# Patient Record
Sex: Male | Born: 1970 | Race: Black or African American | Hispanic: No | Marital: Married | Smoking: Former smoker
Health system: Southern US, Community
[De-identification: ages and names within clinical notes are randomized; demographics above are authoritative.]

---

## 2004-03-12 ENCOUNTER — Emergency Department (HOSPITAL_COMMUNITY): Admission: EM | Admit: 2004-03-12 | Discharge: 2004-03-12 | Payer: Self-pay | Admitting: Emergency Medicine

## 2007-08-19 ENCOUNTER — Emergency Department (HOSPITAL_COMMUNITY): Admission: EM | Admit: 2007-08-19 | Discharge: 2007-08-19 | Payer: Self-pay | Admitting: Emergency Medicine

## 2007-09-07 ENCOUNTER — Emergency Department (HOSPITAL_COMMUNITY): Admission: EM | Admit: 2007-09-07 | Discharge: 2007-09-07 | Payer: Self-pay | Admitting: *Deleted

## 2007-09-14 ENCOUNTER — Emergency Department (HOSPITAL_COMMUNITY): Admission: EM | Admit: 2007-09-14 | Discharge: 2007-09-14 | Payer: Self-pay | Admitting: Family Medicine

## 2008-01-02 ENCOUNTER — Emergency Department (HOSPITAL_COMMUNITY): Admission: EM | Admit: 2008-01-02 | Discharge: 2008-01-02 | Payer: Self-pay | Admitting: Family Medicine

## 2008-04-29 ENCOUNTER — Encounter: Admission: RE | Admit: 2008-04-29 | Discharge: 2008-04-29 | Payer: Self-pay | Admitting: Internal Medicine

## 2010-10-22 LAB — GC/CHLAMYDIA PROBE AMP, GENITAL: GC Probe Amp, Genital: NEGATIVE

## 2011-07-13 ENCOUNTER — Emergency Department (HOSPITAL_COMMUNITY): Payer: Medicaid Other

## 2011-07-13 ENCOUNTER — Encounter (HOSPITAL_COMMUNITY): Payer: Self-pay | Admitting: *Deleted

## 2011-07-13 ENCOUNTER — Emergency Department (HOSPITAL_COMMUNITY)
Admission: EM | Admit: 2011-07-13 | Discharge: 2011-07-13 | Disposition: A | Discharge status: Home / Self Care | Payer: Medicaid Other | Attending: Emergency Medicine | Admitting: Emergency Medicine

## 2011-07-13 DIAGNOSIS — M542 Cervicalgia: Secondary | ICD-10-CM | POA: Insufficient documentation

## 2011-07-13 DIAGNOSIS — F17200 Nicotine dependence, unspecified, uncomplicated: Secondary | ICD-10-CM | POA: Insufficient documentation

## 2011-07-13 MED ORDER — HYDROCODONE-ACETAMINOPHEN 5-325 MG PO TABS: 2.0000 | ORAL_TABLET | ORAL | Status: AC | PRN

## 2011-07-13 MED ORDER — CYCLOBENZAPRINE HCL 10 MG PO TABS: 10.0000 mg | ORAL_TABLET | Freq: Two times a day (BID) | ORAL | Status: AC | PRN
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
## 2012-02-19 ENCOUNTER — Ambulatory Visit (HOSPITAL_COMMUNITY)
Admission: RE | Admit: 2012-02-19 | Discharge: 2012-02-19 | Disposition: A | Discharge status: Home / Self Care | Payer: Medicaid Other | Source: Ambulatory Visit | Attending: Interventional Radiology | Admitting: Interventional Radiology

## 2012-02-19 ENCOUNTER — Other Ambulatory Visit (HOSPITAL_COMMUNITY): Payer: Self-pay | Admitting: Interventional Radiology

## 2012-02-19 DIAGNOSIS — Z792 Long term (current) use of antibiotics: Secondary | ICD-10-CM

## 2012-11-03 ENCOUNTER — Emergency Department (HOSPITAL_COMMUNITY)
Admission: EM | Admit: 2012-11-03 | Discharge: 2012-11-03 | Disposition: A | Discharge status: Home / Self Care | Payer: No Typology Code available for payment source | Attending: Emergency Medicine | Admitting: Emergency Medicine

## 2012-11-03 ENCOUNTER — Encounter (HOSPITAL_COMMUNITY): Payer: Self-pay | Admitting: Emergency Medicine

## 2012-11-03 DIAGNOSIS — Y92410 Unspecified street and highway as the place of occurrence of the external cause: Secondary | ICD-10-CM | POA: Insufficient documentation

## 2012-11-03 DIAGNOSIS — F17200 Nicotine dependence, unspecified, uncomplicated: Secondary | ICD-10-CM | POA: Insufficient documentation

## 2012-11-03 DIAGNOSIS — Y9389 Activity, other specified: Secondary | ICD-10-CM | POA: Insufficient documentation

## 2012-11-03 DIAGNOSIS — M542 Cervicalgia: Secondary | ICD-10-CM

## 2012-11-03 DIAGNOSIS — Z79899 Other long term (current) drug therapy: Secondary | ICD-10-CM | POA: Insufficient documentation

## 2012-11-03 DIAGNOSIS — S0993XA Unspecified injury of face, initial encounter: Secondary | ICD-10-CM | POA: Insufficient documentation

## 2012-11-03 MED ORDER — DIAZEPAM 5 MG PO TABS: 5.0000 mg | ORAL_TABLET | Freq: Two times a day (BID) | ORAL | Status: DC
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
## 2012-11-03 NOTE — ED Provider Notes (Signed)
Medical screening examination/treatment/procedure(s) were performed by non-physician practitioner and as supervising physician I was immediately available for consultation/collaboration.   Dagmar Hait, MD 11/03/12 [phone]
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
## 2015-01-20 DIAGNOSIS — H524 Presbyopia: Secondary | ICD-10-CM | POA: Diagnosis not present

## 2015-01-20 DIAGNOSIS — H52221 Regular astigmatism, right eye: Secondary | ICD-10-CM | POA: Diagnosis not present

## 2015-01-20 DIAGNOSIS — H5203 Hypermetropia, bilateral: Secondary | ICD-10-CM | POA: Diagnosis not present

## 2015-04-01 ENCOUNTER — Ambulatory Visit: Payer: Self-pay | Admitting: Podiatry

## 2016-03-03 ENCOUNTER — Emergency Department (HOSPITAL_COMMUNITY)
Admission: EM | Admit: 2016-03-03 | Discharge: 2016-03-03 | Disposition: A | Discharge status: Home / Self Care | Payer: No Typology Code available for payment source | Attending: Emergency Medicine | Admitting: Emergency Medicine

## 2016-03-03 ENCOUNTER — Encounter (HOSPITAL_COMMUNITY): Payer: Self-pay | Admitting: Emergency Medicine

## 2016-03-03 DIAGNOSIS — S161XXA Strain of muscle, fascia and tendon at neck level, initial encounter: Secondary | ICD-10-CM

## 2016-03-03 DIAGNOSIS — Y939 Activity, unspecified: Secondary | ICD-10-CM | POA: Diagnosis not present

## 2016-03-03 DIAGNOSIS — M62830 Muscle spasm of back: Secondary | ICD-10-CM | POA: Diagnosis not present

## 2016-03-03 DIAGNOSIS — Y92410 Unspecified street and highway as the place of occurrence of the external cause: Secondary | ICD-10-CM | POA: Diagnosis not present

## 2016-03-03 DIAGNOSIS — Y999 Unspecified external cause status: Secondary | ICD-10-CM | POA: Diagnosis not present

## 2016-03-03 DIAGNOSIS — Z79899 Other long term (current) drug therapy: Secondary | ICD-10-CM | POA: Insufficient documentation

## 2016-03-03 DIAGNOSIS — F17210 Nicotine dependence, cigarettes, uncomplicated: Secondary | ICD-10-CM | POA: Diagnosis not present

## 2016-03-03 DIAGNOSIS — M545 Low back pain, unspecified: Secondary | ICD-10-CM

## 2016-03-03 DIAGNOSIS — S199XXA Unspecified injury of neck, initial encounter: Secondary | ICD-10-CM | POA: Diagnosis present

## 2016-03-03 MED ORDER — NAPROXEN 500 MG PO TABS: 500.0000 mg | ORAL_TABLET | Freq: Two times a day (BID) | ORAL | 0 refills | Status: DC | PRN

## 2016-03-03 MED ORDER — CYCLOBENZAPRINE HCL 10 MG PO TABS: 10.0000 mg | ORAL_TABLET | Freq: Three times a day (TID) | ORAL | 0 refills | Status: DC | PRN
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
## 2016-03-03 NOTE — Discharge Instructions (Signed)
Take naprosyn as directed for inflammation and pain with tylenol for breakthrough pain and flexeril for muscle relaxation. Do not drive or operate machinery with muscle relaxant use. Use heat to areas of soreness, no more than 20 minutes at a time every hour. Expect to be sore for the next few days and follow up with primary care physician for recheck of ongoing symptoms in the next 1-2 weeks. Return to ER for emergent changing or worsening of symptoms.
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
## 2017-01-03 ENCOUNTER — Other Ambulatory Visit: Payer: Self-pay

## 2017-01-03 DIAGNOSIS — Z5321 Procedure and treatment not carried out due to patient leaving prior to being seen by health care provider: Secondary | ICD-10-CM | POA: Insufficient documentation

## 2017-01-03 DIAGNOSIS — M549 Dorsalgia, unspecified: Secondary | ICD-10-CM | POA: Insufficient documentation

## 2017-01-04 ENCOUNTER — Emergency Department (HOSPITAL_COMMUNITY)
Admission: EM | Admit: 2017-01-04 | Discharge: 2017-01-05 | Disposition: A | Discharge status: Home / Self Care | Payer: Self-pay | Attending: Emergency Medicine | Admitting: Emergency Medicine

## 2017-01-04 ENCOUNTER — Encounter (HOSPITAL_COMMUNITY): Payer: Self-pay | Admitting: Emergency Medicine

## 2017-01-04 ENCOUNTER — Other Ambulatory Visit: Payer: Self-pay

## 2017-01-04 MED ORDER — OXYCODONE-ACETAMINOPHEN 5-325 MG PO TABS
1.0000 | ORAL_TABLET | ORAL | Status: DC | PRN
  Administered 2017-01-04: 1 via ORAL
  Filled 2017-01-04: qty 1

## 2017-01-04 NOTE — ED Triage Notes (Addendum)
Pt reports last night he has abdominal pain, strained to have a bowel movement.  He reports feeling like something "teared" and now is having extreme back pain that is keeping him from sleeping and his legs are weak when he stands.  His abdominal pain has stopped but he is unable to get any relief when he lays down,.  Pt did take an 800 Ibprofen which has decreased the pain slightly

## 2017-01-04 NOTE — ED Notes (Signed)
Called to reassess vitals -- no answer  

## 2017-01-04 NOTE — ED Notes (Signed)
Called pt to reassess vitals no answer 
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
## 2018-07-16 ENCOUNTER — Encounter: Payer: Self-pay | Admitting: Orthopedic Surgery

## 2018-07-16 ENCOUNTER — Ambulatory Visit (INDEPENDENT_AMBULATORY_CARE_PROVIDER_SITE_OTHER): Payer: Self-pay

## 2018-07-16 ENCOUNTER — Ambulatory Visit (INDEPENDENT_AMBULATORY_CARE_PROVIDER_SITE_OTHER): Payer: Self-pay | Admitting: Orthopedic Surgery

## 2018-07-16 ENCOUNTER — Other Ambulatory Visit: Payer: Self-pay

## 2018-07-16 VITALS — Ht 72.0 in | Wt 160.0 lb

## 2018-07-16 DIAGNOSIS — G8929 Other chronic pain: Secondary | ICD-10-CM

## 2018-07-16 DIAGNOSIS — M542 Cervicalgia: Secondary | ICD-10-CM

## 2018-07-16 DIAGNOSIS — M545 Low back pain, unspecified: Secondary | ICD-10-CM

## 2018-07-16 MED ORDER — METHOCARBAMOL 500 MG PO TABS: 500.0000 mg | ORAL_TABLET | Freq: Three times a day (TID) | ORAL | 0 refills | Status: DC

## 2018-07-16 NOTE — Progress Notes (Signed)
Office Visit Note   Patient: Marc Nielsen           Date of Birth: 11-20-1970           MRN: [id number] Visit Date: 07/16/2018              Requested by: No referring provider defined for this encounter. PCP: System, Pcp Not In  Chief Complaint  Patient presents with  . Neck - Pain  . Lower Back - Pain      HPI: Patient is a 48 year old gentleman who is seen for initial evaluation for arthritic pain in his cervical spine and lumbar spine.  Patient states occasionally has pain that hurts so bad he cannot stand up straight.  He complains of start up stiffness denies any radicular symptoms in the upper or lower extremities.  Patient states that he did get some relief with prednisone but he does not want to take it because of COVID-19.  Assessment & Plan: Visit Diagnoses:  1. Neck pain   2. Chronic midline low back pain without sciatica     Plan: We will call in a prescription for Robaxin recommended continue with exercise recommended exercise sports massage.  Patient has no radicular symptoms do not think an MRI scan is necessary at this time.  Follow-Up Instructions: Return if symptoms worsen or fail to improve.   Ortho Exam  Patient is alert, oriented, no adenopathy, well-dressed, normal affect, normal respiratory effort. Examination patient has good motor strength in all motor groups of both upper and lower extremities.  No focal motor weakness.  Negative sciatic tension sign bilateral lower extremities.  Imaging: Xr Cervical Spine 2 Or 3 Views  Result Date: 07/16/2018 2 view radiographs of the cervical spine shows mild degenerative disc disease from C3-4-5 and 6.  Xr Lumbar Spine 2-3 Views  Result Date: 07/16/2018 2 view radiographs of the lumbar spine shows degenerative disc disease at T12-L1 with good joint space distally with no pars defect  No images are attached to the encounter.  Labs: No results found for: HGBA1C, ESRSEDRATE, CRP, LABURIC, REPTSTATUS,  GRAMSTAIN, CULT, LABORGA   No results found for: ALBUMIN, PREALBUMIN, LABURIC  Body mass index is 21.7 kg/m.  Orders:  Orders Placed This Encounter  Procedures  . XR Cervical Spine 2 or 3 views  . XR Lumbar Spine 2-3 Views   Meds ordered this encounter  Medications  . methocarbamol (ROBAXIN) 500 MG tablet    Sig: Take 1 tablet (500 mg total) by mouth 3 (three) times daily.    Dispense:  30 tablet    Refill:  0  . methocarbamol (ROBAXIN) 500 MG tablet    Sig: Take 1 tablet (500 mg total) by mouth 3 (three) times daily.    Dispense:  30 tablet    Refill:  0     Procedures: No procedures performed  Clinical Data: No additional findings.  ROS:  All other systems negative, except as noted in the HPI. Review of Systems  Objective: Vital Signs: Ht 6' (1.829 m)   Wt 160 lb (72.6 kg)   BMI 21.70 kg/m   Specialty Comments:  No specialty comments available.  PMFS History: There are no active problems to display for this patient.  History reviewed. No pertinent past medical history.  History reviewed. No pertinent family history.  History reviewed. No pertinent surgical history. Social History   Occupational History  . Not on file  Tobacco Use  . Smoking status: Current Every Day  Smoker    Types: Cigarettes  Substance and Sexual Activity  . Alcohol use: No  . Drug use: No  . Sexual activity: Not on file

## 2019-12-22 ENCOUNTER — Ambulatory Visit (INDEPENDENT_AMBULATORY_CARE_PROVIDER_SITE_OTHER): Payer: BC Managed Care – PPO

## 2019-12-22 ENCOUNTER — Ambulatory Visit (HOSPITAL_COMMUNITY)
Admission: EM | Admit: 2019-12-22 | Discharge: 2019-12-22 | Disposition: A | Discharge status: Home / Self Care | Payer: BC Managed Care – PPO | Attending: Emergency Medicine | Admitting: Emergency Medicine

## 2019-12-22 ENCOUNTER — Encounter (HOSPITAL_COMMUNITY): Payer: Self-pay | Admitting: Emergency Medicine

## 2019-12-22 DIAGNOSIS — M542 Cervicalgia: Secondary | ICD-10-CM

## 2019-12-22 MED ORDER — CYCLOBENZAPRINE HCL 10 MG PO TABS: 10.0000 mg | ORAL_TABLET | Freq: Two times a day (BID) | ORAL | 0 refills | Status: DC | PRN

## 2019-12-22 MED ORDER — IBUPROFEN 800 MG PO TABS: 800.0000 mg | ORAL_TABLET | Freq: Three times a day (TID) | ORAL | 0 refills | Status: DC | PRN

## 2019-12-22 NOTE — Discharge Instructions (Addendum)
Take the prescribed ibuprofen as needed for your pain.  Take the muscle relaxer Flexeril as needed for muscle spasm; Do not drive, operate machinery, or drink alcohol with this medication as it may make you drowsy.    Follow up with your primary care provider or an orthopedist if your pain is not improving.
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
## 2020-01-13 ENCOUNTER — Encounter (HOSPITAL_COMMUNITY): Payer: Self-pay

## 2020-01-13 ENCOUNTER — Other Ambulatory Visit: Payer: Self-pay

## 2020-01-13 ENCOUNTER — Ambulatory Visit (HOSPITAL_COMMUNITY)
Admission: EM | Admit: 2020-01-13 | Discharge: 2020-01-13 | Disposition: A | Discharge status: Home / Self Care | Payer: BC Managed Care – PPO | Attending: Family Medicine | Admitting: Family Medicine

## 2020-01-13 DIAGNOSIS — Z20822 Contact with and (suspected) exposure to covid-19: Secondary | ICD-10-CM | POA: Diagnosis not present

## 2020-01-13 DIAGNOSIS — J069 Acute upper respiratory infection, unspecified: Secondary | ICD-10-CM | POA: Diagnosis not present

## 2020-01-13 MED ORDER — GUAIFENESIN ER 600 MG PO TB12: 600.0000 mg | ORAL_TABLET | Freq: Two times a day (BID) | ORAL | 0 refills | Status: DC | PRN

## 2020-01-13 MED ORDER — PROMETHAZINE-DM 6.25-15 MG/5ML PO SYRP: 5.0000 mL | ORAL_SOLUTION | Freq: Four times a day (QID) | ORAL | 0 refills | Status: DC | PRN

## 2020-01-13 NOTE — ED Triage Notes (Signed)
Pt in with c/o headache, congestion, and productive cough that has been going on for 2 days now.  Pt has taken z pak and tessalon with some relief  Denies any fever, diarrhea, vomiting

## 2020-01-13 NOTE — ED Provider Notes (Signed)
[place]    CSN: [id number] Arrival date & time: 01/13/20  1857      History   Chief Complaint Chief Complaint  Patient presents with   Cough   Nasal Congestion    HPI Marc Nielsen is a 49 y.o. male.   Here today with 2 days of headache, congestion, and productive cough, fatigue. Denies fever, chills, CP, SOB, HAs, dizziness, abdominal pain. States he's taken some old zpak and tessalon with mild temporary relief. Denies known pulmonary history and no sick contacts.      History reviewed. No pertinent past medical history.  There are no problems to display for this patient.   History reviewed. No pertinent surgical history.     Home Medications    Prior to Admission medications   Medication Sig Start Date End Date Taking? Authorizing Provider  guaiFENesin (MUCINEX) 600 MG 12 hr tablet Take 1 tablet (600 mg total) by mouth 2 (two) times daily as needed. 01/13/20  Yes Volney American, PA-C  promethazine-dextromethorphan (PROMETHAZINE-DM) 6.25-15 MG/5ML syrup Take 5 mLs by mouth 4 (four) times daily as needed for cough. 01/13/20  Yes Volney American, PA-C  acetaminophen (TYLENOL) 500 MG tablet Take 1,000 mg by mouth every 6 (six) hours as needed. For pain    [provider]  cyclobenzaprine (FLEXERIL) 10 MG tablet Take 1 tablet (10 mg total) by mouth 2 (two) times daily as needed for muscle spasms. 12/22/19   Sharion Balloon, NP  diazepam (VALIUM) 5 MG tablet Take 1 tablet (5 mg total) by mouth 2 (two) times daily. Patient not taking: Reported on 07/16/2018 11/03/12   Noe Gens, PA-C  ibuprofen (ADVIL) 800 MG tablet Take 1 tablet (800 mg total) by mouth every 8 (eight) hours as needed. 12/22/19   Sharion Balloon, NP  methocarbamol (ROBAXIN) 500 MG tablet Take 1 tablet (500 mg total) by mouth 3 (three) times daily. 07/16/18   Newt Minion, MD  methocarbamol (ROBAXIN) 500 MG tablet Take 1 tablet (500 mg total) by mouth 3 (three) times  daily. 07/16/18   Newt Minion, MD  Multiple Vitamin (MULTIVITAMIN WITH MINERALS) TABS Take 1 tablet by mouth daily.    [provider]  naproxen (NAPROSYN) 500 MG tablet Take 1 tablet (500 mg total) by mouth 2 (two) times daily as needed for mild pain, moderate pain or headache (TAKE WITH MEALS.). Patient not taking: Reported on 07/16/2018 03/03/16   Street, [place], [place]    Family History History reviewed. No pertinent family history.  Social History Social History   Tobacco Use   Smoking status: Current Every Day Smoker    Types: Cigarettes   Smokeless tobacco: Never Used  Substance Use Topics   Alcohol use: No   Drug use: No     Allergies   Patient has no known allergies.   Review of Systems Review of Systems PER HPI    Physical Exam Triage Vital Signs ED Triage Vitals  Enc Vitals Group     BP 01/13/20 2019 (!) 148/79     Pulse Rate 01/13/20 2019 (!) 54     Resp 01/13/20 2019 19     Temp 01/13/20 2019 (!) 97.3 F (36.3 C)     Temp Source 01/13/20 2019 Oral     SpO2 01/13/20 2019 99 %     Weight --      Height --      Head Circumference --  Peak Flow --      Pain Score 01/13/20 2018 7     Pain Loc --      Pain Edu? --      Excl. in [place]? --    No data found.  Updated Vital Signs BP (!) 148/79 (BP Location: Right Arm)    Pulse (!) 54    Temp (!) 97.3 F (36.3 C) (Oral)    Resp 19    SpO2 99%   Visual Acuity Right Eye Distance:   Left Eye Distance:   Bilateral Distance:    Right Eye Near:   Left Eye Near:    Bilateral Near:     Physical Exam Vitals and nursing note reviewed.  Constitutional:      Appearance: Normal appearance.  HENT:     Head: Atraumatic.     Right Ear: Tympanic membrane normal.     Left Ear: Tympanic membrane normal.     Nose: Rhinorrhea present.     Mouth/Throat:     Mouth: Mucous membranes are moist.     Pharynx: Posterior oropharyngeal erythema present.  Eyes:     Extraocular Movements: Extraocular  movements intact.     Conjunctiva/sclera: Conjunctivae normal.  Cardiovascular:     Rate and Rhythm: Normal rate and regular rhythm.     Heart sounds: Normal heart sounds.  Pulmonary:     Effort: Pulmonary effort is normal. No respiratory distress.     Breath sounds: Normal breath sounds. No wheezing or rales.  Abdominal:     General: Bowel sounds are normal. There is no distension.     Palpations: Abdomen is soft.     Tenderness: There is no abdominal tenderness. There is no guarding.  Musculoskeletal:        General: Normal range of motion.     Cervical back: Normal range of motion and neck supple.  Skin:    General: Skin is warm and dry.  Neurological:     General: No focal deficit present.     Mental Status: He is oriented to person, place, and time.  Psychiatric:        Mood and Affect: Mood normal.        Thought Content: Thought content normal.        Judgment: Judgment normal.      UC Treatments / Results  Labs (all labs ordered are listed, but only abnormal results are displayed) Labs Reviewed  SARS CORONAVIRUS 2 (TAT 6-24 HRS)    EKG   Radiology No results found.  Procedures Procedures (including critical care time)  Medications Ordered in UC Medications - No data to display  Initial Impression / Assessment and Plan / UC Course  I have reviewed the triage vital signs and the nursing notes.  Pertinent labs & imaging results that were available during my care of the patient were reviewed by me and considered in my medical decision making (see chart for details).     Exam reassuring, COVID pcr pending. Phenergan DM, mucinex, supportive home care, isolation reviewed. Strict return precautions given.    Final Clinical Impressions(s) / UC Diagnoses   Final diagnoses:  Viral URI with cough   Discharge Instructions   None    ED Prescriptions    Medication Sig Dispense Auth. Provider   promethazine-dextromethorphan (PROMETHAZINE-DM) 6.25-15 MG/5ML  syrup Take 5 mLs by mouth 4 (four) times daily as needed for cough. 100 mL Volney American, PA-C   guaiFENesin (MUCINEX) 600 MG 12 hr tablet  Take 1 tablet (600 mg total) by mouth 2 (two) times daily as needed. 30 tablet Particia Nearing, [place]     PDMP not reviewed this encounter.   Particia Nearing, [place] 01/13/20 2106

## 2020-01-14 LAB — SARS CORONAVIRUS 2 (TAT 6-24 HRS): SARS Coronavirus 2: NEGATIVE

## 2020-12-17 ENCOUNTER — Other Ambulatory Visit: Payer: Self-pay | Admitting: Internal Medicine

## 2020-12-17 DIAGNOSIS — M25511 Pain in right shoulder: Secondary | ICD-10-CM

## 2020-12-17 DIAGNOSIS — M25512 Pain in left shoulder: Secondary | ICD-10-CM

## 2021-01-13 ENCOUNTER — Other Ambulatory Visit: Payer: 59

## 2021-03-03 ENCOUNTER — Ambulatory Visit
Admission: RE | Admit: 2021-03-03 | Discharge: 2021-03-03 | Disposition: A | Discharge status: Home / Self Care | Payer: 59 | Source: Ambulatory Visit | Attending: Internal Medicine | Admitting: Internal Medicine

## 2021-03-03 ENCOUNTER — Other Ambulatory Visit: Payer: Self-pay

## 2021-03-03 DIAGNOSIS — S46011A Strain of muscle(s) and tendon(s) of the rotator cuff of right shoulder, initial encounter: Secondary | ICD-10-CM | POA: Diagnosis not present

## 2021-03-03 DIAGNOSIS — S46012A Strain of muscle(s) and tendon(s) of the rotator cuff of left shoulder, initial encounter: Secondary | ICD-10-CM | POA: Diagnosis not present

## 2021-03-03 DIAGNOSIS — M25512 Pain in left shoulder: Secondary | ICD-10-CM

## 2021-03-03 DIAGNOSIS — M25511 Pain in right shoulder: Secondary | ICD-10-CM

## 2021-03-29 DIAGNOSIS — M25511 Pain in right shoulder: Secondary | ICD-10-CM | POA: Diagnosis not present

## 2021-03-29 DIAGNOSIS — M25512 Pain in left shoulder: Secondary | ICD-10-CM | POA: Diagnosis not present

## 2021-04-13 DIAGNOSIS — M25512 Pain in left shoulder: Secondary | ICD-10-CM | POA: Diagnosis not present

## 2021-04-13 DIAGNOSIS — M25511 Pain in right shoulder: Secondary | ICD-10-CM | POA: Diagnosis not present

## 2021-04-26 DIAGNOSIS — M25512 Pain in left shoulder: Secondary | ICD-10-CM | POA: Diagnosis not present

## 2021-04-26 DIAGNOSIS — M25511 Pain in right shoulder: Secondary | ICD-10-CM | POA: Diagnosis not present

## 2021-06-11 DIAGNOSIS — M542 Cervicalgia: Secondary | ICD-10-CM | POA: Diagnosis not present

## 2021-09-15 DIAGNOSIS — Z125 Encounter for screening for malignant neoplasm of prostate: Secondary | ICD-10-CM | POA: Diagnosis not present

## 2021-09-15 DIAGNOSIS — Z0000 Encounter for general adult medical examination without abnormal findings: Secondary | ICD-10-CM | POA: Diagnosis not present

## 2021-09-15 DIAGNOSIS — Z6827 Body mass index (BMI) 27.0-27.9, adult: Secondary | ICD-10-CM | POA: Diagnosis not present

## 2021-09-15 DIAGNOSIS — Z131 Encounter for screening for diabetes mellitus: Secondary | ICD-10-CM | POA: Diagnosis not present

## 2021-09-16 DIAGNOSIS — Z131 Encounter for screening for diabetes mellitus: Secondary | ICD-10-CM | POA: Diagnosis not present

## 2021-09-16 DIAGNOSIS — Z125 Encounter for screening for malignant neoplasm of prostate: Secondary | ICD-10-CM | POA: Diagnosis not present

## 2021-09-22 DIAGNOSIS — D539 Nutritional anemia, unspecified: Secondary | ICD-10-CM | POA: Diagnosis not present

## 2021-09-28 DIAGNOSIS — Z1211 Encounter for screening for malignant neoplasm of colon: Secondary | ICD-10-CM | POA: Diagnosis not present

## 2021-09-28 DIAGNOSIS — K219 Gastro-esophageal reflux disease without esophagitis: Secondary | ICD-10-CM | POA: Diagnosis not present

## 2021-09-28 DIAGNOSIS — D509 Iron deficiency anemia, unspecified: Secondary | ICD-10-CM | POA: Diagnosis not present

## 2021-09-28 DIAGNOSIS — R635 Abnormal weight gain: Secondary | ICD-10-CM | POA: Diagnosis not present

## 2021-09-28 DIAGNOSIS — K625 Hemorrhage of anus and rectum: Secondary | ICD-10-CM | POA: Diagnosis not present

## 2021-10-06 DIAGNOSIS — K2950 Unspecified chronic gastritis without bleeding: Secondary | ICD-10-CM | POA: Diagnosis not present

## 2021-10-06 DIAGNOSIS — K449 Diaphragmatic hernia without obstruction or gangrene: Secondary | ICD-10-CM | POA: Diagnosis not present

## 2021-10-06 DIAGNOSIS — B9681 Helicobacter pylori [H. pylori] as the cause of diseases classified elsewhere: Secondary | ICD-10-CM | POA: Diagnosis not present

## 2021-10-06 DIAGNOSIS — Z800 Family history of malignant neoplasm of digestive organs: Secondary | ICD-10-CM | POA: Diagnosis not present

## 2021-10-06 DIAGNOSIS — Z1211 Encounter for screening for malignant neoplasm of colon: Secondary | ICD-10-CM | POA: Diagnosis not present

## 2021-10-06 DIAGNOSIS — Z86010 Personal history of colonic polyps: Secondary | ICD-10-CM | POA: Diagnosis not present

## 2021-10-06 DIAGNOSIS — K2970 Gastritis, unspecified, without bleeding: Secondary | ICD-10-CM | POA: Diagnosis not present

## 2021-10-06 DIAGNOSIS — K31A11 Gastric intestinal metaplasia without dysplasia, involving the antrum: Secondary | ICD-10-CM | POA: Diagnosis not present

## 2021-10-06 DIAGNOSIS — K5730 Diverticulosis of large intestine without perforation or abscess without bleeding: Secondary | ICD-10-CM | POA: Diagnosis not present

## 2021-10-06 DIAGNOSIS — K625 Hemorrhage of anus and rectum: Secondary | ICD-10-CM | POA: Diagnosis not present

## 2021-10-06 DIAGNOSIS — K219 Gastro-esophageal reflux disease without esophagitis: Secondary | ICD-10-CM | POA: Diagnosis not present

## 2021-11-16 DIAGNOSIS — K5730 Diverticulosis of large intestine without perforation or abscess without bleeding: Secondary | ICD-10-CM | POA: Diagnosis not present

## 2021-11-16 DIAGNOSIS — B9681 Helicobacter pylori [H. pylori] as the cause of diseases classified elsewhere: Secondary | ICD-10-CM | POA: Diagnosis not present

## 2021-12-31 DIAGNOSIS — M25511 Pain in right shoulder: Secondary | ICD-10-CM | POA: Diagnosis not present

## 2021-12-31 DIAGNOSIS — M25512 Pain in left shoulder: Secondary | ICD-10-CM | POA: Diagnosis not present

## 2022-01-18 DIAGNOSIS — K600 Acute anal fissure: Secondary | ICD-10-CM | POA: Diagnosis not present

## 2022-01-18 DIAGNOSIS — K6289 Other specified diseases of anus and rectum: Secondary | ICD-10-CM | POA: Diagnosis not present

## 2022-01-18 DIAGNOSIS — K625 Hemorrhage of anus and rectum: Secondary | ICD-10-CM | POA: Diagnosis not present

## 2022-01-18 DIAGNOSIS — K5730 Diverticulosis of large intestine without perforation or abscess without bleeding: Secondary | ICD-10-CM | POA: Diagnosis not present

## 2022-01-20 DIAGNOSIS — M25512 Pain in left shoulder: Secondary | ICD-10-CM | POA: Diagnosis not present

## 2022-03-07 DIAGNOSIS — M25519 Pain in unspecified shoulder: Secondary | ICD-10-CM | POA: Diagnosis not present

## 2022-03-07 DIAGNOSIS — N183 Chronic kidney disease, stage 3 unspecified: Secondary | ICD-10-CM | POA: Diagnosis not present

## 2022-03-07 DIAGNOSIS — Z125 Encounter for screening for malignant neoplasm of prostate: Secondary | ICD-10-CM | POA: Diagnosis not present

## 2022-03-07 DIAGNOSIS — R7303 Prediabetes: Secondary | ICD-10-CM | POA: Diagnosis not present

## 2022-03-07 DIAGNOSIS — K219 Gastro-esophageal reflux disease without esophagitis: Secondary | ICD-10-CM | POA: Diagnosis not present

## 2022-03-07 DIAGNOSIS — D539 Nutritional anemia, unspecified: Secondary | ICD-10-CM | POA: Diagnosis not present

## 2022-03-07 DIAGNOSIS — M1990 Unspecified osteoarthritis, unspecified site: Secondary | ICD-10-CM | POA: Diagnosis not present

## 2022-03-07 DIAGNOSIS — R5383 Other fatigue: Secondary | ICD-10-CM | POA: Diagnosis not present

## 2022-03-08 ENCOUNTER — Inpatient Hospital Stay (HOSPITAL_COMMUNITY)
Admission: EM | Admit: 2022-03-08 | Discharge: 2022-03-10 | DRG: 835 | Disposition: A | Discharge status: Other Acute Inpatient Hospital | Payer: Commercial Managed Care - PPO | Attending: Family Medicine | Admitting: Family Medicine

## 2022-03-08 ENCOUNTER — Encounter (HOSPITAL_COMMUNITY): Payer: Self-pay

## 2022-03-08 ENCOUNTER — Other Ambulatory Visit: Payer: Self-pay

## 2022-03-08 DIAGNOSIS — A048 Other specified bacterial intestinal infections: Secondary | ICD-10-CM | POA: Diagnosis present

## 2022-03-08 DIAGNOSIS — Z87891 Personal history of nicotine dependence: Secondary | ICD-10-CM

## 2022-03-08 DIAGNOSIS — D539 Nutritional anemia, unspecified: Secondary | ICD-10-CM | POA: Diagnosis present

## 2022-03-08 DIAGNOSIS — N179 Acute kidney failure, unspecified: Secondary | ICD-10-CM | POA: Diagnosis present

## 2022-03-08 DIAGNOSIS — C9200 Acute myeloblastic leukemia, not having achieved remission: Principal | ICD-10-CM | POA: Diagnosis present

## 2022-03-08 DIAGNOSIS — D649 Anemia, unspecified: Secondary | ICD-10-CM | POA: Diagnosis not present

## 2022-03-08 DIAGNOSIS — Z8619 Personal history of other infectious and parasitic diseases: Secondary | ICD-10-CM

## 2022-03-08 DIAGNOSIS — M75101 Unspecified rotator cuff tear or rupture of right shoulder, not specified as traumatic: Secondary | ICD-10-CM | POA: Diagnosis present

## 2022-03-08 DIAGNOSIS — M7552 Bursitis of left shoulder: Secondary | ICD-10-CM | POA: Diagnosis present

## 2022-03-08 DIAGNOSIS — Z833 Family history of diabetes mellitus: Secondary | ICD-10-CM

## 2022-03-08 DIAGNOSIS — Z8719 Personal history of other diseases of the digestive system: Secondary | ICD-10-CM

## 2022-03-08 DIAGNOSIS — Z79899 Other long term (current) drug therapy: Secondary | ICD-10-CM

## 2022-03-08 LAB — TYPE AND SCREEN: Unit division: 0

## 2022-03-08 LAB — CBC WITH DIFFERENTIAL/PLATELET
Abs Immature Granulocytes: 0 10*3/uL (ref 0.00–0.07)
Basophils Absolute: 0 10*3/uL (ref 0.0–0.1)
Basophils Relative: 0 %
Eosinophils Absolute: 0 10*3/uL (ref 0.0–0.5)
Eosinophils Relative: 0 %
HCT: 19.1 % — ABNORMAL LOW (ref 39.0–52.0)
Hemoglobin: 6.4 g/dL — CL (ref 13.0–17.0)
Immature Granulocytes: 0 %
Lymphocytes Relative: 77 %
Lymphs Abs: 3.6 10*3/uL (ref 0.7–4.0)
MCH: 35.6 pg — ABNORMAL HIGH (ref 26.0–34.0)
MCHC: 33.5 g/dL (ref 30.0–36.0)
MCV: 106.1 fL — ABNORMAL HIGH (ref 80.0–100.0)
Monocytes Absolute: 0.3 10*3/uL (ref 0.1–1.0)
Monocytes Relative: 7 %
Neutro Abs: 0.8 10*3/uL — ABNORMAL LOW (ref 1.7–7.7)
Neutrophils Relative %: 16 %
Platelets: 150 10*3/uL (ref 150–400)
RBC: 1.8 MIL/uL — ABNORMAL LOW (ref 4.22–5.81)
RDW: 19.6 % — ABNORMAL HIGH (ref 11.5–15.5)
WBC: 4.7 10*3/uL (ref 4.0–10.5)
nRBC: 1.1 % — ABNORMAL HIGH (ref 0.0–0.2)

## 2022-03-08 LAB — POC OCCULT BLOOD, ED: Fecal Occult Bld: NEGATIVE

## 2022-03-08 LAB — BASIC METABOLIC PANEL
Anion gap: 9 (ref 5–15)
BUN: 18 mg/dL (ref 6–20)
CO2: 26 mmol/L (ref 22–32)
Calcium: 9 mg/dL (ref 8.9–10.3)
Chloride: 104 mmol/L (ref 98–111)
Creatinine, Ser: 1.6 mg/dL — ABNORMAL HIGH (ref 0.61–1.24)
GFR, Estimated: 51 mL/min — ABNORMAL LOW (ref >60)
Glucose, Bld: 101 mg/dL — ABNORMAL HIGH (ref 70–99)
Potassium: 4 mmol/L (ref 3.5–5.1)
Sodium: 139 mmol/L (ref 135–145)

## 2022-03-08 LAB — PREPARE RBC (CROSSMATCH)

## 2022-03-08 LAB — ABO/RH: ABO/RH(D): A POS

## 2022-03-08 MED ORDER — ACETAMINOPHEN 650 MG RE SUPP: 650.0000 mg | Freq: Four times a day (QID) | RECTAL | Status: DC | PRN

## 2022-03-08 MED ORDER — DOCUSATE SODIUM 100 MG PO CAPS
100.0000 mg | ORAL_CAPSULE | Freq: Two times a day (BID) | ORAL | Status: DC
  Administered 2022-03-09 – 2022-03-10 (×3): 100 mg via ORAL
  Filled 2022-03-08 (×3): qty 1

## 2022-03-08 MED ORDER — LACTATED RINGERS IV SOLN: INTRAVENOUS | Status: DC

## 2022-03-08 MED ORDER — SODIUM CHLORIDE 0.9% IV SOLUTION: Freq: Once | INTRAVENOUS | Status: DC

## 2022-03-08 MED ORDER — METHOCARBAMOL 500 MG PO TABS: 500.0000 mg | ORAL_TABLET | Freq: Three times a day (TID) | ORAL | Status: DC | PRN

## 2022-03-08 MED ORDER — ONDANSETRON HCL 4 MG PO TABS: 4.0000 mg | ORAL_TABLET | Freq: Four times a day (QID) | ORAL | Status: DC | PRN

## 2022-03-08 MED ORDER — ONDANSETRON HCL 4 MG/2ML IJ SOLN: 4.0000 mg | Freq: Four times a day (QID) | INTRAMUSCULAR | Status: DC | PRN

## 2022-03-08 MED ORDER — SODIUM CHLORIDE 0.9 % IV BOLUS
1000.0000 mL | Freq: Once | INTRAVENOUS | Status: AC
  Administered 2022-03-08: 1000 mL via INTRAVENOUS

## 2022-03-08 MED ORDER — TRAMADOL HCL 50 MG PO TABS
50.0000 mg | ORAL_TABLET | Freq: Four times a day (QID) | ORAL | Status: DC | PRN
  Administered 2022-03-08 – 2022-03-09 (×3): 50 mg via ORAL
  Filled 2022-03-08 (×3): qty 1

## 2022-03-08 MED ORDER — POLYETHYLENE GLYCOL 3350 17 G PO PACK: 17.0000 g | PACK | Freq: Every day | ORAL | Status: DC | PRN

## 2022-03-08 MED ORDER — HYDROCORTISONE (PERIANAL) 2.5 % EX CREA
TOPICAL_CREAM | Freq: Two times a day (BID) | CUTANEOUS | Status: DC | PRN
  Filled 2022-03-08: qty 28.35

## 2022-03-08 MED ORDER — ACETAMINOPHEN 325 MG PO TABS: 650.0000 mg | ORAL_TABLET | Freq: Four times a day (QID) | ORAL | Status: DC | PRN

## 2022-03-08 NOTE — ED Provider Notes (Signed)
[place] [facility] Provider Note   CSN: [id number] Arrival date & time: 03/08/22  1313     History  Chief Complaint  Patient presents with   Abnormal Lab   Palpitations    Marc Nielsen is a 52 y.o. male.  52 year old male sent here due to low hemoglobin.  Patient had blood work done approximately 4 months ago that showed anemia at that time.  Was sent for a GI workup and patient had endoscopy as well as colonoscopy.  According to the patient and his wife who is at bedside patient had H. pylori.  Placed medications for this.  States over the last several weeks he has had some increased weakness.  Was recently placed on iron therapy 2 weeks ago.  Saw his physician yesterday and had blood work done and those outside records were reviewed.  His hemoglobin is 6.9.  Patient denies any current active GI bleeding issues.  Presents for further evaluation       Home Medications Prior to Admission medications   Medication Sig Start Date End Date Taking? Authorizing Provider  acetaminophen (TYLENOL) 500 MG tablet Take 1,000 mg by mouth every 6 (six) hours as needed. For pain    [provider]  cyclobenzaprine (FLEXERIL) 10 MG tablet Take 1 tablet (10 mg total) by mouth 2 (two) times daily as needed for muscle spasms. 12/22/19   Sharion Balloon, NP  diazepam (VALIUM) 5 MG tablet Take 1 tablet (5 mg total) by mouth 2 (two) times daily. Patient not taking: Reported on 07/16/2018 11/03/12   Noe Gens, PA-C  guaiFENesin (MUCINEX) 600 MG 12 hr tablet Take 1 tablet (600 mg total) by mouth 2 (two) times daily as needed. 01/13/20   Volney American, PA-C  ibuprofen (ADVIL) 800 MG tablet Take 1 tablet (800 mg total) by mouth every 8 (eight) hours as needed. 12/22/19   Sharion Balloon, NP  methocarbamol (ROBAXIN) 500 MG tablet Take 1 tablet (500 mg total) by mouth 3 (three) times daily. 07/16/18   Newt Minion, MD  methocarbamol (ROBAXIN) 500 MG  tablet Take 1 tablet (500 mg total) by mouth 3 (three) times daily. 07/16/18   Newt Minion, MD  Multiple Vitamin (MULTIVITAMIN WITH MINERALS) TABS Take 1 tablet by mouth daily.    [provider]  naproxen (NAPROSYN) 500 MG tablet Take 1 tablet (500 mg total) by mouth 2 (two) times daily as needed for mild pain, moderate pain or headache (TAKE WITH MEALS.). Patient not taking: Reported on 07/16/2018 03/03/16   Street, [place], PA-C  promethazine-dextromethorphan (PROMETHAZINE-DM) 6.25-15 MG/5ML syrup Take 5 mLs by mouth 4 (four) times daily as needed for cough. 01/13/20   Volney American, PA-C      Allergies    Patient has no known allergies.    Review of Systems   Review of Systems  All other systems reviewed and are negative.   Physical Exam Updated Vital Signs BP 132/64 (BP Location: Right Arm)   Pulse 74   Temp 99.2 F (37.3 C) (Oral)   SpO2 100%  Physical Exam Vitals and nursing note reviewed.  Constitutional:      General: He is not in acute distress.    Appearance: Normal appearance. He is well-developed. He is not toxic-appearing.  HENT:     Head: Normocephalic and atraumatic.  Eyes:     General: Lids are normal.     Conjunctiva/sclera: Conjunctivae normal.  Pupils: Pupils are equal, round, and reactive to light.  Neck:     Thyroid: No thyroid mass.     Trachea: No tracheal deviation.  Cardiovascular:     Rate and Rhythm: Normal rate and regular rhythm.     Heart sounds: Normal heart sounds. No murmur heard.    No gallop.  Pulmonary:     Effort: Pulmonary effort is normal. No respiratory distress.     Breath sounds: Normal breath sounds. No stridor. No decreased breath sounds, wheezing, rhonchi or rales.  Abdominal:     General: There is no distension.     Palpations: Abdomen is soft.     Tenderness: There is no abdominal tenderness. There is no rebound.  Musculoskeletal:        General: No tenderness. Normal range of motion.     Cervical  back: Normal range of motion and neck supple.  Skin:    General: Skin is warm and dry.     Findings: No abrasion or rash.  Neurological:     Mental Status: He is alert and oriented to person, place, and time. Mental status is at baseline.     GCS: GCS eye subscore is 4. GCS verbal subscore is 5. GCS motor subscore is 6.     Cranial Nerves: No cranial nerve deficit.     Sensory: No sensory deficit.     Motor: Motor function is intact.  Psychiatric:        Attention and Perception: Attention normal.        Speech: Speech normal.        Behavior: Behavior normal.     ED Results / Procedures / Treatments   Labs (all labs ordered are listed, but only abnormal results are displayed) Labs Reviewed  CBC WITH DIFFERENTIAL/PLATELET  BASIC METABOLIC PANEL  POC OCCULT BLOOD, ED  TYPE AND SCREEN    EKG None  Radiology No results found.  Procedures Procedures    Medications Ordered in ED Medications  lactated ringers infusion (has no administration in time range)    ED Course/ Medical Decision Making/ A&P                             Medical Decision Making Amount and/or Complexity of Data Reviewed Labs: ordered.  Risk Prescription drug management.   Patient's hemoglobin here is 6.4.  No clear source of bleeding at this time.  2 units of packed red blood cells ordered.  Slight AKI noted.  Patient given IV fluids.  Will admit to the hospitalist service.        Final Clinical Impression(s) / ED Diagnoses Final diagnoses:  None    Rx / DC Orders ED Discharge Orders     None         Lacretia Leigh, MD 03/08/22 [phone]

## 2022-03-08 NOTE — ED Triage Notes (Signed)
Pt presents with c/o abnormal lab. Pt reports that he had some blood work done at his MD office and was told his hemoglobin was low at 6.2 and he needed to come to the ER for a transfusion. Pt reports feeling tired as well as experiencing some palpitations.

## 2022-03-08 NOTE — ED Notes (Signed)
Blood bank has blood ready for this patient. Notified Hannah,RN. °

## 2022-03-08 NOTE — H&P (Signed)
History and Physical  Patient: Marc Nielsen [id number] DOB: 11-09-1970 DOA: 03/08/2022 DOS: the patient was seen and examined on 03/08/2022 Patient coming from: Home  Chief Complaint:  Chief Complaint  Patient presents with   Abnormal Lab   Palpitations   HPI: Marc Nielsen is a 52 y.o. male with PMH significant of anemia secondary to BRBPR secondary to H. pylori infection and anal fissure, bilateral shoulder pain currently treated with intermittent injections present to the hospital with complaints of abnormal lab. Patient went for a follow-up with his PCP on 03/07/2022, for anemia identified in 9/23.  At which time his hemoglobin was found 6.9 and patient was referred to ED for further workup.  Patient's primary complaint is fatigue and lack of energy ongoing since Thanksgiving.  Progressively worsening.  Patient went to see PCP in September.  He was found to have anemia and was referred to Dr. Collene Mares for further evaluation.  Per patient, PCP told him that "he was 1 point above the need for transfusion". Patient underwent EGD and colonoscopy which per patient were unremarkable.  Patient was treated for H. pylori infection. Somewhere in November 2023 patient had significant bleeding per rectum which was secondary to anal fissure and he was treated with stool softener, iron pills and rectal cream. Patient reports infrequent intermittent spot of blood when he wipes since that episode.  No other bleeding reported. Patient denies any constipation or diarrhea. Denies any nausea vomiting or epigastric pain. Patient does not take any ibuprofen Aleve naproxen or other NSAIDs. Denies any alcohol drinking. Is not a smoker right now.  Quit smoking 2 years ago. Patient is not losing any weight.  Appetite is adequate. Has any dyspnea on exertion.  Denies any chest pain on exertion.  No dizziness no lightheadedness or syncopal events. No prior history of blood transfusion.  Review of Systems: As  mentioned in the history of present illness. All other systems reviewed and are negative. Past Medical History:  Diagnosis Date   History of anal fissures 03/08/2022   History of H. pylori infection 03/08/2022   Subacromial bursitis of left shoulder joint 03/08/2022   Tear of right supraspinatus tendon 03/08/2022   History reviewed. No pertinent surgical history. Social History:  reports that he has quit smoking. His smoking use included cigarettes. He has never been exposed to tobacco smoke. He quit smokeless tobacco use about 2 years ago. He reports that he does not drink alcohol and does not use drugs. No Known Allergies Family History  Problem Relation Age of Onset   Diabetes Mother    Anemia Mother    Prior to Admission medications   Medication Sig Start Date End Date Taking? Authorizing Provider  docusate sodium (COLACE) 100 MG capsule Take 100 mg by mouth 2 (two) times daily.   Yes [provider]  Ferrous Sulfate (IRON PO) Take by mouth.   Yes [provider]  acetaminophen (TYLENOL) 500 MG tablet Take 1,000 mg by mouth every 6 (six) hours as needed. For pain    [provider]   Physical Exam: Vitals:   03/08/22 1515 03/08/22 1530 03/08/22 1545 03/08/22 1650  BP: 111/60 113/66 119/78 129/65  Pulse: 67 64 73 64  Resp: 10 14 16   $ Temp:    97.8 F (36.6 C)  TempSrc:    Oral  SpO2: 99% 100% 97% 100%   General: Appear in mild distress; no visible Abnormal Neck Mass Or lumps, Conjunctiva normal Cardiovascular: S1 and S2 Present,  no Murmur, Respiratory: good respiratory effort, Bilateral Air entry present and CTA, no Crackles, no wheezes Abdomen: Bowel Sound present, Non tender  Extremities: no Pedal edema Neurology: alert and oriented to time, place, and person Gait not checked due to patient safety concerns   Data Reviewed: I have reviewed ED notes, Vitals, Lab results and outpatient records. Since last encounter, pertinent lab results CBC and BMP    . I have ordered test including CBC, BMP, fibrinogen, folic acid, LFT, haptoglobin, HIV (per hospital protocol), iron and TIBC, kappa lambda free light chain, INR, reticulocyte, ESR, smear review, urinalysis, B12  .   Assessment and Plan Macrocytic anemia History of H. pylori infection History of anal fissures Patient presents with complaints of symptomatic anemia with fatigue and tiredness.  Hemoglobin is 6.9.  Will provide 1 unit of PRBC transfusion. Reportedly his last hemoglobin was around high 7s or low 8s in September 2023. Initiate further workup including 123456, folic acid, iron level, ESR, haptoglobin, reticulocyte count, DAT, fibrinogen. Given combination of anemia and AKI will also check kappa lambda free light chain ratio. Hemoccult is negative. Patient does not report any episode of active bleeding in the last 1 to 2 weeks. Will allow for regular diet for now. Do not think the GI consult is indicated for now. Will add oral PPI given history of H. pylori and possibly gastritis. Continue Colace, MiraLAX for history of anal fissure.  AKI (acute kidney injury) ([place]) We do not have any prior blood work for this patient but current serum creatinine is 1.6. This i could be a CKD stage IIIa but no definitive way to prove that for now Will provide IV hydration and monitor renal function. Check UA. Check in and out. If urine output is less or serum creatinine does not improve with hydration tomorrow patient will require further workup. Again checking kappa lambda free light chain ratio given presence of anemia and AKI. May require hematology follow-up outpatient.  Subacromial bursitis of left shoulder joint Tear of right supraspinatus tendon Patient is following up with sports medicine and getting intermittent injections in his left shoulder. Patient denies using any NSAIDs.  Patient will follow-up with outpatient orthopedics for shoulder injections in 2 to 3  days. Monitor.  Advance Care Planning:   Code Status: Full Code  Consults: None Family Communication: Wife at bedside.  Author: Berle Mull, MD 03/08/2022 5:14 PM For on call review [email or web address].

## 2022-03-09 DIAGNOSIS — C9590 Leukemia, unspecified not having achieved remission: Secondary | ICD-10-CM

## 2022-03-09 DIAGNOSIS — Z87891 Personal history of nicotine dependence: Secondary | ICD-10-CM | POA: Diagnosis not present

## 2022-03-09 DIAGNOSIS — N179 Acute kidney failure, unspecified: Secondary | ICD-10-CM | POA: Diagnosis not present

## 2022-03-09 DIAGNOSIS — D696 Thrombocytopenia, unspecified: Secondary | ICD-10-CM

## 2022-03-09 DIAGNOSIS — Z833 Family history of diabetes mellitus: Secondary | ICD-10-CM | POA: Diagnosis not present

## 2022-03-09 DIAGNOSIS — L0293 Carbuncle, unspecified: Secondary | ICD-10-CM | POA: Diagnosis not present

## 2022-03-09 DIAGNOSIS — Z95828 Presence of other vascular implants and grafts: Secondary | ICD-10-CM | POA: Diagnosis not present

## 2022-03-09 DIAGNOSIS — C9200 Acute myeloblastic leukemia, not having achieved remission: Principal | ICD-10-CM

## 2022-03-09 DIAGNOSIS — Z8619 Personal history of other infectious and parasitic diseases: Secondary | ICD-10-CM | POA: Diagnosis not present

## 2022-03-09 DIAGNOSIS — L02432 Carbuncle of left axilla: Secondary | ICD-10-CM | POA: Diagnosis not present

## 2022-03-09 DIAGNOSIS — D539 Nutritional anemia, unspecified: Secondary | ICD-10-CM | POA: Diagnosis not present

## 2022-03-09 DIAGNOSIS — Z79899 Other long term (current) drug therapy: Secondary | ICD-10-CM | POA: Diagnosis not present

## 2022-03-09 DIAGNOSIS — D649 Anemia, unspecified: Secondary | ICD-10-CM | POA: Diagnosis present

## 2022-03-09 DIAGNOSIS — M7552 Bursitis of left shoulder: Secondary | ICD-10-CM | POA: Diagnosis not present

## 2022-03-09 DIAGNOSIS — D7589 Other specified diseases of blood and blood-forming organs: Secondary | ICD-10-CM | POA: Diagnosis not present

## 2022-03-09 DIAGNOSIS — R002 Palpitations: Secondary | ICD-10-CM | POA: Diagnosis not present

## 2022-03-09 HISTORY — DX: Leukemia, unspecified not having achieved remission: C95.90

## 2022-03-09 LAB — COMPREHENSIVE METABOLIC PANEL
ALT: 21 U/L (ref 0–44)
AST: 19 U/L (ref 15–41)
Albumin: 3.9 g/dL (ref 3.5–5.0)
Alkaline Phosphatase: 27 U/L — ABNORMAL LOW (ref 38–126)
Anion gap: 5 (ref 5–15)
BUN: 21 mg/dL — ABNORMAL HIGH (ref 6–20)
CO2: 23 mmol/L (ref 22–32)
Calcium: 8.5 mg/dL — ABNORMAL LOW (ref 8.9–10.3)
Chloride: 106 mmol/L (ref 98–111)
Creatinine, Ser: 1.55 mg/dL — ABNORMAL HIGH (ref 0.61–1.24)
GFR, Estimated: 53 mL/min — ABNORMAL LOW (ref >60)
Glucose, Bld: 115 mg/dL — ABNORMAL HIGH (ref 70–99)
Potassium: 3.6 mmol/L (ref 3.5–5.1)
Sodium: 134 mmol/L — ABNORMAL LOW (ref 135–145)
Total Bilirubin: 0.5 mg/dL (ref 0.3–1.2)
Total Protein: 7.4 g/dL (ref 6.5–8.1)

## 2022-03-09 LAB — CBC WITH DIFFERENTIAL/PLATELET
Basophils Absolute: 0 10*3/uL (ref 0.0–0.1)
Basophils Relative: 0 %
Blasts: 29 %
Eosinophils Absolute: 0 10*3/uL (ref 0.0–0.5)
Eosinophils Relative: 0 %
HCT: 24.5 % — ABNORMAL LOW (ref 39.0–52.0)
Hemoglobin: 8.4 g/dL — ABNORMAL LOW (ref 13.0–17.0)
Immature Granulocytes: 0 %
Lymphocytes Relative: 42 %
Lymphs Abs: 2.1 10*3/uL (ref 0.7–4.0)
MCH: 32.6 pg (ref 26.0–34.0)
MCHC: 34.3 g/dL (ref 30.0–36.0)
MCV: 95 fL (ref 80.0–100.0)
Monocytes Absolute: 0.2 10*3/uL (ref 0.1–1.0)
Monocytes Relative: 3 %
Neutro Abs: 1.3 10*3/uL — ABNORMAL LOW (ref 1.7–7.7)
Neutrophils Relative %: 26 %
Platelets: 120 10*3/uL — ABNORMAL LOW (ref 150–400)
RBC: 2.58 MIL/uL — ABNORMAL LOW (ref 4.22–5.81)
RDW: 22.7 % — ABNORMAL HIGH (ref 11.5–15.5)
WBC: 5 10*3/uL (ref 4.0–10.5)
nRBC: 1 % — ABNORMAL HIGH (ref 0.0–0.2)

## 2022-03-09 LAB — URINALYSIS, ROUTINE W REFLEX MICROSCOPIC
Bacteria, UA: NONE SEEN
Bilirubin Urine: NEGATIVE
Glucose, UA: NEGATIVE mg/dL
Ketones, ur: NEGATIVE mg/dL
Leukocytes,Ua: NEGATIVE
Nitrite: NEGATIVE
Protein, ur: NEGATIVE mg/dL
Specific Gravity, Urine: 1.015 (ref 1.005–1.030)
pH: 7 (ref 5.0–8.0)

## 2022-03-09 LAB — FOLATE: Folate: 4.7 ng/mL — ABNORMAL LOW (ref >5.9)

## 2022-03-09 LAB — SEDIMENTATION RATE: Sed Rate: 45 mm/hr — ABNORMAL HIGH (ref 0–16)

## 2022-03-09 LAB — BPAM RBC
Blood Product Expiration Date: 202403132359
Blood Product Expiration Date: 202403132359
ISSUE DATE / TIME: 202402201940
ISSUE DATE / TIME: 202402202249
Unit Type and Rh: 6200
Unit Type and Rh: 6200

## 2022-03-09 LAB — DIRECT ANTIGLOBULIN TEST (NOT AT ARMC)
DAT, IgG: NEGATIVE
DAT, complement: NEGATIVE

## 2022-03-09 LAB — IRON AND TIBC
Iron: 208 ug/dL — ABNORMAL HIGH (ref 45–182)
Saturation Ratios: 77 % — ABNORMAL HIGH (ref 17.9–39.5)
TIBC: 272 ug/dL (ref 250–450)
UIBC: 64 ug/dL

## 2022-03-09 LAB — RETICULOCYTES
Immature Retic Fract: 34.6 % — ABNORMAL HIGH (ref 2.3–15.9)
RBC.: 2.55 MIL/uL — ABNORMAL LOW (ref 4.22–5.81)
Retic Count, Absolute: 47.4 10*3/uL (ref 19.0–186.0)
Retic Ct Pct: 1.9 % (ref 0.4–3.1)

## 2022-03-09 LAB — HEPATITIS PANEL, ACUTE
HCV Ab: NONREACTIVE
Hep A IgM: NONREACTIVE
Hep B C IgM: NONREACTIVE
Hepatitis B Surface Ag: NONREACTIVE

## 2022-03-09 LAB — HEPATIC FUNCTION PANEL
ALT: 21 U/L (ref 0–44)
AST: 21 U/L (ref 15–41)
Albumin: 3.7 g/dL (ref 3.5–5.0)
Alkaline Phosphatase: 27 U/L — ABNORMAL LOW (ref 38–126)
Bilirubin, Direct: 0.1 mg/dL (ref 0.0–0.2)
Indirect Bilirubin: 0.5 mg/dL (ref 0.3–0.9)
Total Bilirubin: 0.6 mg/dL (ref 0.3–1.2)
Total Protein: 7.3 g/dL (ref 6.5–8.1)

## 2022-03-09 LAB — TYPE AND SCREEN
ABO/RH(D): A POS
Antibody Screen: NEGATIVE
Unit division: 0

## 2022-03-09 LAB — VITAMIN B12: Vitamin B-12: 1273 pg/mL — ABNORMAL HIGH (ref 180–914)

## 2022-03-09 LAB — FIBRINOGEN: Fibrinogen: 375 mg/dL (ref 210–475)

## 2022-03-09 LAB — HIV ANTIBODY (ROUTINE TESTING W REFLEX): HIV Screen 4th Generation wRfx: NONREACTIVE

## 2022-03-09 LAB — URIC ACID: Uric Acid, Serum: 5.2 mg/dL (ref 3.7–8.6)

## 2022-03-09 LAB — PROTIME-INR
INR: 1 (ref 0.8–1.2)
Prothrombin Time: 13.6 seconds (ref 11.4–15.2)

## 2022-03-09 LAB — LACTATE DEHYDROGENASE: LDH: 230 U/L — ABNORMAL HIGH (ref 98–192)

## 2022-03-09 LAB — SURGICAL PATHOLOGY

## 2022-03-09 LAB — PATHOLOGIST SMEAR REVIEW

## 2022-03-09 LAB — FERRITIN: Ferritin: 212 ng/mL (ref 24–336)

## 2022-03-09 LAB — TECHNOLOGIST SMEAR REVIEW

## 2022-03-09 LAB — MAGNESIUM: Magnesium: 2.1 mg/dL (ref 1.7–2.4)

## 2022-03-09 MED ORDER — FOLIC ACID 1 MG PO TABS
1.0000 mg | ORAL_TABLET | Freq: Every day | ORAL | Status: DC
  Administered 2022-03-09 – 2022-03-10 (×2): 1 mg via ORAL
  Filled 2022-03-09 (×2): qty 1

## 2022-03-09 MED ORDER — DOXYCYCLINE HYCLATE 100 MG PO TABS
100.0000 mg | ORAL_TABLET | Freq: Two times a day (BID) | ORAL | Status: DC
  Administered 2022-03-09 – 2022-03-10 (×2): 100 mg via ORAL
  Filled 2022-03-09 (×2): qty 1

## 2022-03-09 NOTE — TOC Initial Note (Signed)
Transition of Care [facility]) - Initial/Assessment Note    Patient Details  Name: Marc Nielsen MRN: [id number] Date of Birth: 01-19-1970  Transition of Care [facility]) CM/SW Contact:    Ninfa Meeker, RN Phone Number: 03/09/2022, 9:19 AM  Clinical Narrative:                  Transition of Care Screening Note:  Patient is a 52 yr old male being treated for  macrocytic anemia, Hgb 8.4. IV hydration for renal function, BUN 21, Crt.1.56.   Transition of Care [facility]) Department has reviewed patient and no TOC needs have been identified at this time. We will continue to monitor patient advancement through Interdisciplinary progressions and if new patient needs arise, please place a consult.       Patient Goals and CMS Choice            Expected Discharge Plan and Services                                              Prior Living Arrangements/Services                       Activities of Daily Living Home Assistive Devices/Equipment: None ADL Screening (condition at time of admission) Patient's cognitive ability adequate to safely complete daily activities?: Yes Is the patient deaf or have difficulty hearing?: No Does the patient have difficulty seeing, even when wearing glasses/contacts?: No Does the patient have difficulty concentrating, remembering, or making decisions?: No Patient able to express need for assistance with ADLs?: No Does the patient have difficulty dressing or bathing?: No Independently performs ADLs?: No Communication: Independent Dressing (OT): Independent Grooming: Independent Feeding: Independent Bathing: Independent Toileting: Independent In/Out Bed: Independent Walks in Home: Independent Does the patient have difficulty walking or climbing stairs?: No Weakness of Legs: None Weakness of Arms/Hands: None  Permission Sought/Granted                  Emotional Assessment              Admission diagnosis:  Anemia  [D64.9] Symptomatic anemia [D64.9] Patient Active Problem List   Diagnosis Date Noted   Macrocytic anemia 03/08/2022   History of H. pylori infection 03/08/2022   AKI (acute kidney injury) ([place]) 03/08/2022   Subacromial bursitis of left shoulder joint 03/08/2022   Tear of right supraspinatus tendon 03/08/2022   History of anal fissures 03/08/2022   PCP:  Elwyn Reach, MD Pharmacy:   [id number] [place], [id number] [id number]: [phone]: [phone]    Social Determinants of Health (SDOH) Social History: SDOH Screenings   Food Insecurity: No Food Insecurity (03/08/2022)  Housing: Low Risk  (03/08/2022)  Transportation Needs: No Transportation Needs (03/08/2022)  Utilities: Not At Risk (03/08/2022)  Tobacco Use: Medium Risk (03/08/2022)   SDOH Interventions:     Readmission Risk Interventions     No data to display

## 2022-03-09 NOTE — Discharge Summary (Signed)
Physician Discharge Summary  [name] [id number] DOB: 12/07/1970 DOA: 03/08/2022  PCP: Marc Reach, MD  Admit date: 03/08/2022 Discharge date: 03/09/2022    Admitted From: Home Disposition: [facility]  Recommendations for Outpatient Follow-up:  Follow up with PCP in 1-2 weeks Please obtain BMP/CBC in one week Please follow up with your PCP on the following pending results: Unresulted Labs (From admission, onward)     Start     Ordered   03/09/22 0742  Methylmalonic acid, serum  Once,   R        03/09/22 0742   03/09/22 0741  Intrinsic Factor Antibodies  Once,   R        03/09/22 0741   03/08/22 1708  Haptoglobin  Once,   R       Question:  Specimen collection method  Answer:  Lab=Lab collect   03/08/22 1708   03/08/22 1708  Kappa/lambda light chains  Once,   R       Question:  Specimen collection method  Answer:  Lab=Lab collect   03/08/22 [place]: None Equipment/Devices: None  Discharge Condition: Stable CODE STATUS: Full code Diet recommendation: Regular  HPI: Marc Nielsen is a 52 y.o. male with PMH significant of anemia secondary to BRBPR secondary to H. pylori infection and anal fissure, bilateral shoulder pain currently treated with intermittent injections present to the hospital with complaints of abnormal lab. Patient went for a follow-up with his PCP on 03/07/2022, for anemia identified in 9/23.  At which time his hemoglobin was found 6.9 and patient was referred to ED for further workup.  Patient's primary complaint is fatigue and lack of energy ongoing since Thanksgiving.  Progressively worsening.   Patient went to see PCP in September.  He was found to have anemia and was referred to Dr. Collene Mares for further evaluation.  Per patient, PCP told him that "he was 1 point above the need for transfusion". Patient underwent EGD and colonoscopy which per patient were unremarkable.  Patient was treated for H. pylori  infection. Somewhere in November 2023 patient had significant bleeding per rectum which was secondary to anal fissure and he was treated with stool softener, iron pills and rectal cream. Patient reports infrequent intermittent spot of blood when he wipes since that episode.  No other bleeding reported. Patient denies any constipation or diarrhea. Denies any nausea vomiting or epigastric pain. Patient does not take any ibuprofen Aleve naproxen or other NSAIDs. Denies any alcohol drinking. Is not a smoker right now.  Quit smoking 2 years ago. Patient is not losing any weight.  Appetite is adequate. Has any dyspnea on exertion.  Denies any chest pain on exertion.  No dizziness no lightheadedness or syncopal events. No prior history of blood transfusion.  Subjective: Seen and.  He says that his weakness and shortness of breath is better since he received blood transfusion yesterday.  Brief/Interim Summary: Patient was admitted due to anemia, source was unknown but he has history of fissure.  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  I broke the news with the patient, patient and his wife  who was at the bedside were in tears.  They had several questions which were answered by Dr. Burr Medico herself.  Patient in agreement with plan to transfer to [facility].  Per Dr. Burr Medico from oncology, patient has been accepted to acute leukemia unit at [facility] and will likely receive a bed offer within 24 hours and then will be transferred/discharged.  Discharge plan was discussed with patient and/or family member and they verbalized understanding and agreed with it.  Discharge Diagnoses:   Principal Problem:   Macrocytic anemia Active Problems:   History of H. pylori infection   AKI (acute kidney injury) ([facility])   Subacromial bursitis of left shoulder joint   Tear of right supraspinatus tendon   History of anal fissures    Discharge Instructions   Allergies as of 03/09/2022   No Known Allergies      Medication List     TAKE these medications    docusate sodium 100 MG capsule Commonly known as: COLACE Take 100-200 mg by mouth daily.   FERROCITE PO Take 1 tablet by mouth daily with breakfast.   TYLENOL 500 MG tablet Generic drug: acetaminophen Take 500-1,000 mg by mouth every 6 (six) hours as needed for mild pain or headache.        Follow-up Information     Marc Reach, MD Follow up in 1 week(s).   Specialty: Internal Medicine Contact information: [id number] [place] [phone]                No Known Allergies  Consultations: Oncology   Procedures/Studies: No results found.   Discharge Exam: Vitals:   03/09/22 0052 03/09/22 0528  BP: 125/60 (!) 103/55  Pulse: 66 64  Resp:  18  Temp: 98.9 F (37.2 C) 98.7 F (37.1 C)  SpO2: 100% 100%   Vitals:   03/08/22 2255 03/08/22 2310 03/09/22 0052 03/09/22 0528  BP: 133/66 127/65 125/60 (!) 103/55  Pulse: 66 65 66 64  Resp:    18  Temp: 99 F (37.2 C) 99 F (37.2 C) 98.9 F (37.2 C) 98.7 F (37.1 C)  TempSrc: Oral Oral Oral   SpO2: 100% 100% 100% 100%  Weight:      Height:        General: Pt is alert, awake, not in acute distress Cardiovascular: RRR, S1/S2 +, no rubs, no gallops Respiratory: CTA bilaterally, no wheezing, no rhonchi Abdominal: Soft, NT, ND, bowel sounds + Extremities: no edema, no cyanosis    The results of significant diagnostics from this hospitalization (including imaging, microbiology, ancillary and laboratory) are listed below for reference.     Microbiology: No results found for this or any previous visit (from  the past 240 hour(s)).   Labs: BNP (last 3 results) No results for input(s): "BNP" in the last 8760 hours. Basic Metabolic Panel: Recent Labs  Lab 03/08/22 1438 03/09/22 0340  NA 139 134*  K 4.0 3.6  CL 104 106  CO2 26 23  GLUCOSE 101* 115*  BUN 18 21*  CREATININE 1.60* 1.55*  CALCIUM 9.0 8.5*  MG  --  2.1   Liver Function Tests: Recent Labs  Lab 03/09/22 0338 03/09/22 0340  AST 21 19  ALT 21 21  ALKPHOS 27* 27*  BILITOT 0.6 0.5  PROT 7.3 7.4  ALBUMIN 3.7 3.9   No results for input(s): "LIPASE", "AMYLASE" in the last 168 hours. No results for input(s): "AMMONIA" in the last 168  hours. CBC: Recent Labs  Lab 03/08/22 1438 03/09/22 0340  WBC 4.7 5.0  NEUTROABS 0.8* 1.3*  HGB 6.4* 8.4*  HCT 19.1* 24.5*  MCV 106.1* 95.0  PLT 150 120*   Cardiac Enzymes: No results for input(s): "CKTOTAL", "CKMB", "CKMBINDEX", "TROPONINI" in the last 168 hours. BNP: Invalid input(s): "POCBNP" CBG: No results for input(s): "GLUCAP" in the last 168 hours. D-Dimer No results for input(s): "DDIMER" in the last 72 hours. Hgb A1c No results for input(s): "HGBA1C" in the last 72 hours. Lipid Profile No results for input(s): "CHOL", "HDL", "LDLCALC", "TRIG", "CHOLHDL", "LDLDIRECT" in the last 72 hours. Thyroid function studies No results for input(s): "TSH", "T4TOTAL", "T3FREE", "THYROIDAB" in the last 72 hours.  Invalid input(s): "FREET3" Anemia work up Recent Labs    03/09/22 0340  VITAMINB12 1,273*  FOLATE 4.7*  FERRITIN 212  TIBC 272  IRON 208*  RETICCTPCT 1.9   Urinalysis    Component Value Date/Time   COLORURINE STRAW (A) 03/08/2022 1709   APPEARANCEUR CLEAR 03/08/2022 1709   LABSPEC 1.015 03/08/2022 1709   PHURINE 7.0 03/08/2022 1709   GLUCOSEU NEGATIVE 03/08/2022 1709   HGBUR SMALL (A) 03/08/2022 1709   BILIRUBINUR NEGATIVE 03/08/2022 1709   KETONESUR NEGATIVE 03/08/2022 1709   PROTEINUR NEGATIVE 03/08/2022 1709   NITRITE NEGATIVE 03/08/2022 1709    LEUKOCYTESUR NEGATIVE 03/08/2022 1709   Sepsis Labs Recent Labs  Lab 03/08/22 1438 03/09/22 0340  WBC 4.7 5.0   Microbiology No results found for this or any previous visit (from the past 240 hour(s)).   Time coordinating discharge: Over 30 minutes  SIGNED:   Darliss Cheney, MD  [facility] 03/09/2022, 11:33 AM *Please note that this is a verbal dictation therefore any spelling or grammatical errors are due to the "[employer] [employer]" system interpretation. If 7PM-7AM, please contact night-coverage [email or web address]

## 2022-03-09 NOTE — Consult Note (Signed)
[place]  [phone]   HEMATOLOGY ONCOLOGY INPATIENT CONSULTATION   RAJU ROUTH  DOB: Aug 19, 1970  MR#: [id number]  CSN#: [id number]    Requesting Physician: [facility]  Patient Care Team: Elwyn Reach, MD as PCP - General (Internal Medicine)  Reason for consult: AML   History of present illness:   52 year old gentleman with past medical history of anemia, H. pylori infection, bilateral shoulder pain, presented with worsening fatigue and dyspnea on exertion to hospital yesterday.  He is peripheral blood smear was concerning for acute myeloid leukemia, I was called to evaluate patient.    He presented to primary care physician in September 2023 for fatigue and was found to be anemic, he underwent GI workup which were negative.  He denies any significant bleeding, bruising, fever, or recent infection.  His hemoglobin was 6.4 on admission yesterday, with MCV 106, the rest of CBC was normal.  He received blood transfusion yesterday.  Repeated CBC this morning showed hemoglobin 8.4, platelet 120 K, WBC 5.0, differential showed blasts 29%.  Peripheral blood smear reviewed by pathologist, concerning for acute leukemia.   MEDICAL HISTORY:  Past Medical History:  Diagnosis Date   History of anal fissures 03/08/2022   History of H. pylori infection 03/08/2022   Subacromial bursitis of left shoulder joint 03/08/2022   Tear of right supraspinatus tendon 03/08/2022    SURGICAL HISTORY: History reviewed. No pertinent surgical history.  SOCIAL HISTORY: Social History   Socioeconomic History   Marital status: Married    Spouse name: Not on file   Number of children: Not on file   Years of education: Not on file   Highest education level: Not on file  Occupational History   Not on file  Tobacco Use   Smoking status: Former    Types: Cigarettes    Passive exposure: Never   Smokeless tobacco: Former    Quit date: 02/25/2020  Substance and Sexual  Activity   Alcohol use: No   Drug use: No   Sexual activity: Yes  Other Topics Concern   Not on file  Social History Narrative   Not on file   Social Determinants of Health   Financial Resource Strain: Not on file  Food Insecurity: No Food Insecurity (03/08/2022)   Hunger Vital Sign    Worried About Running Out of Food in the Last Year: Never true    Ran Out of Food in the Last Year: Never true  Transportation Needs: No Transportation Needs (03/08/2022)   PRAPARE - Hydrologist (Medical): No    Lack of Transportation (Non-Medical): No  Physical Activity: Not on file  Stress: Not on file  Social Connections: Not on file  Intimate Partner Violence: Not At Risk (03/08/2022)   Humiliation, Afraid, Rape, and Kick questionnaire    Fear of Current or Ex-Partner: No    Emotionally Abused: No    Physically Abused: No    Sexually Abused: No    FAMILY HISTORY: Family History  Problem Relation Age of Onset   Diabetes Mother    Anemia Mother     ALLERGIES:  has No Known Allergies.  MEDICATIONS:  Current Facility-Administered Medications  Medication Dose Route Frequency Provider Last Rate Last Admin   0.9 %  sodium chloride infusion (Manually program via Guardrails IV Fluids)   Intravenous Once Lacretia Leigh, MD       acetaminophen (TYLENOL) tablet 650 mg  650 mg Oral Q6H PRN Posey Pronto,  Josetta Huddle, MD       Or   acetaminophen (TYLENOL) suppository 650 mg  650 mg Rectal Q6H PRN Lavina Hamman, MD       docusate sodium (COLACE) capsule 100 mg  100 mg Oral BID Lavina Hamman, MD   100 mg at [id number]   folic acid (FOLVITE) tablet 1 mg  1 mg Oral Daily Darliss Cheney, MD   1 mg at 03/09/22 0915   hydrocortisone (ANUSOL-HC) 2.5 % rectal cream   Rectal BID PRN Lavina Hamman, MD   Given at 03/08/22 2025   lactated ringers infusion   Intravenous Continuous Lacretia Leigh, MD 125 mL/hr at 03/08/22 1808 New Bag at 03/08/22 1808   methocarbamol (ROBAXIN) tablet  500 mg  500 mg Oral Q8H PRN Lavina Hamman, MD       ondansetron [facility]) tablet 4 mg  4 mg Oral Q6H PRN Lavina Hamman, MD       Or   ondansetron [facility]) injection 4 mg  4 mg Intravenous Q6H PRN Lavina Hamman, MD       polyethylene glycol (MIRALAX / GLYCOLAX) packet 17 g  17 g Oral Daily PRN Lavina Hamman, MD       traMADol Veatrice Bourbon) tablet 50 mg  50 mg Oral Q6H PRN Lavina Hamman, MD   50 mg at 03/09/22 [id number]    REVIEW OF SYSTEMS:   Constitutional: Denies fevers, chills or abnormal night sweats Eyes: Denies blurriness of vision, double vision or watery eyes Ears, nose, mouth, throat, and face: Denies mucositis or sore throat Respiratory: Denies cough, dyspnea or wheezes Cardiovascular: Denies palpitation, chest discomfort or lower extremity swelling Gastrointestinal:  Denies nausea, heartburn or change in bowel habits Skin: Denies abnormal skin rashes Lymphatics: Denies new lymphadenopathy or easy bruising Neurological:Denies numbness, tingling or new weaknesses Behavioral/Psych: Mood is stable, no new changes  All other systems were reviewed with the patient and are negative.  PHYSICAL EXAMINATION: ECOG PERFORMANCE STATUS: 2 - Symptomatic, <50% confined to bed  Vitals:   03/09/22 0052 03/09/22 0528  BP: 125/60 (!) 103/55  Pulse: 66 64  Resp:  18  Temp: 98.9 F (37.2 C) 98.7 F (37.1 C)  SpO2: 100% 100%   Filed Weights   03/08/22 1802  Weight: 204 lb 5.9 oz (92.7 kg)    GENERAL:alert, no distress and comfortable SKIN: skin color, texture, turgor are normal, no rashes or significant lesions EYES: normal, conjunctiva are pink and non-injected, sclera clear OROPHARYNX:no exudate, no erythema and lips, buccal mucosa, and tongue normal  NECK: supple, thyroid normal size, non-tender, without nodularity LYMPH:  no palpable lymphadenopathy in the cervical, axillary or inguinal LUNGS: clear to auscultation and percussion with normal breathing effort HEART: regular rate &  rhythm and no murmurs and no lower extremity edema ABDOMEN:abdomen soft, non-tender and normal bowel sounds Musculoskeletal:no cyanosis of digits and no clubbing  PSYCH: alert & oriented x 3 with fluent speech NEURO: no focal motor/sensory deficits  LABORATORY DATA:  I have reviewed the data as listed Lab Results  Component Value Date   WBC 5.0 03/09/2022   HGB 8.4 (L) 03/09/2022   HCT 24.5 (L) 03/09/2022   MCV 95.0 03/09/2022   PLT 120 (L) 03/09/2022   Recent Labs    03/08/22 1438 03/09/22 0338 03/09/22 0340  NA 139  --  134*  K 4.0  --  3.6  CL 104  --  106  CO2 26  --  23  GLUCOSE 101*  --  115*  BUN 18  --  21*  CREATININE 1.60*  --  1.55*  CALCIUM 9.0  --  8.5*  GFRNONAA 51*  --  53*  PROT  --  7.3 7.4  ALBUMIN  --  3.7 3.9  AST  --  21 19  ALT  --  21 21  ALKPHOS  --  27* 27*  BILITOT  --  0.6 0.5  BILIDIR  --  0.1  --   IBILI  --  0.5  --     RADIOGRAPHIC STUDIES: I have personally reviewed the radiological images as listed and agreed with the findings in the report. No results found.  ASSESSMENT & PLAN: 47-year-old gentleman with past medical history of anemia, H. pylori infection, bilateral shoulder pain, presented with worsening fatigue and dyspnea on exertion to hospital yesterday.  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 Mild thrombocytopenia, [place] secondary to AML. AKI vs CKD    Recommendations: -I have personally reviewed his peripheral blood smear with pathologist this morning, he has about 30% blasts in the peripheral smear -I ordered a uric acid, LDH and hepatitis panel -I discussed the lab findings and diagnosis with patient and his extensive family this morning in detail.  I discussed treatment options for AML, likely he will get induction chemotherapy in hospital, he will need a bone marrow biopsy for further molecular testing, to define the subtype of AML and option of targeted therapy. -Since we do not have  acute leukemia service, I recommend him to be transferred to [facility] for further management and treatment.  He agrees.  I have called [facility] and spoke with their leukemia attending, they have agreed to take him, the transfer center will call his floor nurse when bed is available, likely within next 24 hours  -I will f/u as needed before transfer.   All questions were answered. The patient knows to call the clinic with any problems, questions or concerns.      Truitt Merle, MD 03/09/2022 12:50 PM

## 2022-03-10 DIAGNOSIS — N179 Acute kidney failure, unspecified: Secondary | ICD-10-CM | POA: Diagnosis not present

## 2022-03-10 DIAGNOSIS — L02432 Carbuncle of left axilla: Secondary | ICD-10-CM | POA: Diagnosis not present

## 2022-03-10 DIAGNOSIS — D539 Nutritional anemia, unspecified: Secondary | ICD-10-CM | POA: Diagnosis not present

## 2022-03-10 DIAGNOSIS — Z95828 Presence of other vascular implants and grafts: Secondary | ICD-10-CM | POA: Diagnosis not present

## 2022-03-10 DIAGNOSIS — Z79899 Other long term (current) drug therapy: Secondary | ICD-10-CM | POA: Diagnosis not present

## 2022-03-10 DIAGNOSIS — D7589 Other specified diseases of blood and blood-forming organs: Secondary | ICD-10-CM | POA: Diagnosis not present

## 2022-03-10 DIAGNOSIS — C9200 Acute myeloblastic leukemia, not having achieved remission: Secondary | ICD-10-CM | POA: Diagnosis not present

## 2022-03-10 DIAGNOSIS — Z87891 Personal history of nicotine dependence: Secondary | ICD-10-CM | POA: Diagnosis not present

## 2022-03-10 DIAGNOSIS — D649 Anemia, unspecified: Secondary | ICD-10-CM | POA: Diagnosis not present

## 2022-03-10 DIAGNOSIS — L0293 Carbuncle, unspecified: Secondary | ICD-10-CM | POA: Diagnosis not present

## 2022-03-10 DIAGNOSIS — D696 Thrombocytopenia, unspecified: Secondary | ICD-10-CM | POA: Diagnosis not present

## 2022-03-10 LAB — KAPPA/LAMBDA LIGHT CHAINS
Kappa free light chain: 33.7 mg/L — ABNORMAL HIGH (ref 3.3–19.4)
Kappa, lambda light chain ratio: 1.59 (ref 0.26–1.65)
Lambda free light chains: 21.2 mg/L (ref 5.7–26.3)

## 2022-03-10 LAB — INTRINSIC FACTOR ANTIBODIES: Intrinsic Factor: 1 AU/mL (ref 0.0–1.1)

## 2022-03-10 LAB — BASIC METABOLIC PANEL
Anion gap: 11 (ref 5–15)
BUN: 14 mg/dL (ref 6–20)
CO2: 23 mmol/L (ref 22–32)
Calcium: 9.2 mg/dL (ref 8.9–10.3)
Chloride: 103 mmol/L (ref 98–111)
Creatinine, Ser: 1.61 mg/dL — ABNORMAL HIGH (ref 0.61–1.24)
GFR, Estimated: 51 mL/min — ABNORMAL LOW (ref >60)
Glucose, Bld: 99 mg/dL (ref 70–99)
Potassium: 3.7 mmol/L (ref 3.5–5.1)
Sodium: 137 mmol/L (ref 135–145)

## 2022-03-10 LAB — CBC
HCT: 27.3 % — ABNORMAL LOW (ref 39.0–52.0)
Hemoglobin: 9.2 g/dL — ABNORMAL LOW (ref 13.0–17.0)
MCH: 32.9 pg (ref 26.0–34.0)
MCHC: 33.7 g/dL (ref 30.0–36.0)
MCV: 97.5 fL (ref 80.0–100.0)
Platelets: 124 10*3/uL — ABNORMAL LOW (ref 150–400)
RBC: 2.8 MIL/uL — ABNORMAL LOW (ref 4.22–5.81)
RDW: 22.3 % — ABNORMAL HIGH (ref 11.5–15.5)
WBC: 7.3 10*3/uL (ref 4.0–10.5)
nRBC: 0.7 % — ABNORMAL HIGH (ref 0.0–0.2)

## 2022-03-10 LAB — HAPTOGLOBIN: Haptoglobin: 178 mg/dL (ref 29–370)

## 2022-03-10 MED ORDER — DOXYCYCLINE HYCLATE 100 MG PO TABS: 100.0000 mg | ORAL_TABLET | Freq: Two times a day (BID) | ORAL | 0 refills | Status: AC

## 2022-03-10 MED ORDER — POLYETHYLENE GLYCOL 3350 17 G PO PACK
17.0000 g | PACK | Freq: Every day | ORAL | Status: DC
  Administered 2022-03-10: 17 g via ORAL
  Filled 2022-03-10: qty 1

## 2022-03-10 NOTE — Progress Notes (Signed)
Received call from [facility] that patient would have a bed most likely on Friday 03/11/22.

## 2022-03-10 NOTE — Discharge Summary (Signed)
Physician Discharge Summary  Marc Nielsen [id number] DOB: 12-03-1970 DOA: 03/08/2022  PCP: Elwyn Reach, MD  Admit date: 03/08/2022 Discharge date: 03/10/2022    Admitted From: Home Disposition: [facility]  Recommendations for Outpatient Follow-up:  Follow up with PCP in 1-2 weeks Please obtain BMP/CBC in one week Please follow up with your PCP on the following pending results: Unresulted Labs (From admission, onward)     Start     Ordered   03/09/22 0742  Methylmalonic acid, serum  Once,   R        03/09/22 0742   03/09/22 0741  Intrinsic Factor Antibodies  Once,   R        03/09/22 0741   03/08/22 1708  Kappa/lambda light chains  Once,   R       Question:  Specimen collection method  Answer:  Lab=Lab collect   03/08/22 [place]: None Equipment/Devices: None  Discharge Condition: Stable CODE STATUS: Full code Diet recommendation: Regular  HPI: Marc Nielsen is a 52 y.o. male with PMH significant of anemia secondary to BRBPR secondary to H. pylori infection and anal fissure, bilateral shoulder pain currently treated with intermittent injections present to the hospital with complaints of abnormal lab. Patient went for a follow-up with his PCP on 03/07/2022, for anemia identified in 9/23.  At which time his hemoglobin was found 6.9 and patient was referred to ED for further workup.  Patient's primary complaint is fatigue and lack of energy ongoing since Thanksgiving.  Progressively worsening.   Patient went to see PCP in September.  He was found to have anemia and was referred to Dr. Collene Mares for further evaluation.  Per patient, PCP told him that "he was 1 point above the need for transfusion". Patient underwent EGD and colonoscopy which per patient were unremarkable.  Patient was treated for H. pylori infection. Somewhere in November 2023 patient had significant bleeding per rectum which was secondary to anal fissure and he was  treated with stool softener, iron pills and rectal cream. Patient reports infrequent intermittent spot of blood when he wipes since that episode.  No other bleeding reported. Patient denies any constipation or diarrhea. Denies any nausea vomiting or epigastric pain. Patient does not take any ibuprofen Aleve naproxen or other NSAIDs. Denies any alcohol drinking. Is not a smoker right now.  Quit smoking 2 years ago. Patient is not losing any weight.  Appetite is adequate. Has any dyspnea on exertion.  Denies any chest pain on exertion.  No dizziness no lightheadedness or syncopal events. No prior history of blood transfusion.  Subjective: Seen and.  He says that his weakness and shortness of breath is better since he received blood transfusion yesterday.  Brief/Interim Summary: Patient was admitted due to anemia, source was unknown but he has history of fissure.  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  Oncology was consulted, Dr. Burr Medico confirmed that he has acute leukemia and recommended transferring to acute leukemia unit at [facility] and she had made the calls for that as well.  I broke the news with the patient, patient and his wife who was at the bedside were in tears.  They had several questions which were answered by Dr. Burr Medico herself.  Patient in agreement with plan to transfer to [facility].  Per Dr. Burr Medico from oncology, patient has been accepted to acute leukemia unit at [facility] and will likely receive a bed offer within 24 hours and then will be transferred/discharged.  Addendum 03/10/2022: As mentioned above, patient's discharge paperwork was completed yesterday in anticipation of receiving a bed offer and transfer to [facility], however he did not get any bed until this morning, I just received a call from Dr. Alyson Ingles from  [facility], I provided her with signout, current condition and vitals, I was informed that patient has a bed at [facility] and he will be transferred today.  Patient and family has been made aware.  Patient has remained stable compared to yesterday and in fact he feels better and clinically appears better as well.  Also, his PCP Dr. Jonelle Sidle reached out to me yesterday that patient had carbuncle in the left axilla for which she was started on doxycycline and had taken only 1 to 2 days worth of dosing and he requested placing that order so he was started on doxycycline for that.  Discharge plan was discussed with patient and/or family member and they verbalized understanding and agreed with it.  Discharge Diagnoses:  Principal Problem:   Macrocytic anemia Active Problems:   History of H. pylori infection   AKI (acute kidney injury) (HCC)   Subacromial bursitis of left shoulder joint   Tear of right supraspinatus tendon   History of anal fissures   Anemia    Discharge Instructions   Allergies as of 03/10/2022   No Known Allergies      Medication List     TAKE these medications    docusate sodium 100 MG capsule Commonly known as: COLACE Take 100-200 mg by mouth daily.   doxycycline 100 MG tablet Commonly known as: VIBRA-TABS Take 1 tablet (100 mg total) by mouth every 12 (twelve) hours for 5 days.   FERROCITE PO Take 1 tablet by mouth daily with breakfast.   TYLENOL 500 MG tablet Generic drug: acetaminophen Take 500-1,000 mg by mouth every 6 (six) hours as needed for mild pain or headache.        Follow-up Information     Elwyn Reach, MD Follow up in 1 week(s).   Specialty: Internal Medicine Contact information: [id number] [place] [id number] [phone]                No Known Allergies  Consultations: Oncology   Procedures/Studies: No results found.   Discharge Exam: Vitals:   03/09/22 1941 03/10/22 0426  BP:  127/70 135/77  Pulse: 77 71  Resp: 19 16  Temp: 98.5 F (36.9 C) 98.1 F (36.7 C)  SpO2: 99% 100%   Vitals:   03/09/22 0528 03/09/22 1424 03/09/22 1941 03/10/22 0426  BP: (!) 103/55 (!) 147/74 127/70 135/77  Pulse: 64 79 77 71  Resp: 18 16 19 16  $ Temp: 98.7 F (37.1 C) 99.5 F (37.5 C) 98.5 F (36.9 C) 98.1 F (36.7 C)  TempSrc:      SpO2: 100% 100% 99% 100%  Weight:      Height:        General: Pt is alert, awake, not in acute distress Cardiovascular: RRR, S1/S2 +, no rubs, no gallops Respiratory: CTA bilaterally, no wheezing, no rhonchi Abdominal: Soft, NT, ND, bowel sounds + Extremities: no edema, no cyanosis    The results of significant diagnostics from this hospitalization (including imaging, microbiology, ancillary and laboratory)  are listed below for reference.     Microbiology: No results found for this or any previous visit (from the past 240 hour(s)).   Labs: BNP (last 3 results) No results for input(s): "BNP" in the last 8760 hours. Basic Metabolic Panel: Recent Labs  Lab 03/08/22 1438 03/09/22 0340 03/10/22 0411  NA 139 134* 137  K 4.0 3.6 3.7  CL 104 106 103  CO2 26 23 23  $ GLUCOSE 101* 115* 99  BUN 18 21* 14  CREATININE 1.60* 1.55* 1.61*  CALCIUM 9.0 8.5* 9.2  MG  --  2.1  --    Liver Function Tests: Recent Labs  Lab 03/09/22 0338 03/09/22 0340  AST 21 19  ALT 21 21  ALKPHOS 27* 27*  BILITOT 0.6 0.5  PROT 7.3 7.4  ALBUMIN 3.7 3.9   No results for input(s): "LIPASE", "AMYLASE" in the last 168 hours. No results for input(s): "AMMONIA" in the last 168 hours. CBC: Recent Labs  Lab 03/08/22 1438 03/09/22 0340 03/10/22 0411  WBC 4.7 5.0 7.3  NEUTROABS 0.8* 1.3*  --   HGB 6.4* 8.4* 9.2*  HCT 19.1* 24.5* 27.3*  MCV 106.1* 95.0 97.5  PLT 150 120* 124*   Cardiac Enzymes: No results for input(s): "CKTOTAL", "CKMB", "CKMBINDEX", "TROPONINI" in the last 168 hours. BNP: Invalid input(s): "POCBNP" CBG: No results for input(s):  "GLUCAP" in the last 168 hours. D-Dimer No results for input(s): "DDIMER" in the last 72 hours. Hgb A1c No results for input(s): "HGBA1C" in the last 72 hours. Lipid Profile No results for input(s): "CHOL", "HDL", "LDLCALC", "TRIG", "CHOLHDL", "LDLDIRECT" in the last 72 hours. Thyroid function studies No results for input(s): "TSH", "T4TOTAL", "T3FREE", "THYROIDAB" in the last 72 hours.  Invalid input(s): "FREET3" Anemia work up Recent Labs    03/09/22 0340  VITAMINB12 1,273*  FOLATE 4.7*  FERRITIN 212  TIBC 272  IRON 208*  RETICCTPCT 1.9   Urinalysis    Component Value Date/Time   COLORURINE STRAW (A) 03/08/2022 1709   APPEARANCEUR CLEAR 03/08/2022 1709   LABSPEC 1.015 03/08/2022 1709   PHURINE 7.0 03/08/2022 1709   GLUCOSEU NEGATIVE 03/08/2022 1709   HGBUR SMALL (A) 03/08/2022 1709   BILIRUBINUR NEGATIVE 03/08/2022 1709   KETONESUR NEGATIVE 03/08/2022 1709   PROTEINUR NEGATIVE 03/08/2022 1709   NITRITE NEGATIVE 03/08/2022 1709   LEUKOCYTESUR NEGATIVE 03/08/2022 1709   Sepsis Labs Recent Labs  Lab 03/08/22 1438 03/09/22 0340 03/10/22 0411  WBC 4.7 5.0 7.3   Microbiology No results found for this or any previous visit (from the past 240 hour(s)).   Time coordinating discharge: Over 30 minutes  SIGNED:   Darliss Cheney, MD  [facility] 03/10/2022, 10:05 AM *Please note that this is a verbal dictation therefore any spelling or grammatical errors are due to the "[employer] [place] [employer]" system interpretation. If 7PM-7AM, please contact night-coverage [email or web address]

## 2022-03-15 DIAGNOSIS — M25512 Pain in left shoulder: Secondary | ICD-10-CM | POA: Diagnosis not present

## 2022-03-15 DIAGNOSIS — R1032 Left lower quadrant pain: Secondary | ICD-10-CM | POA: Diagnosis not present

## 2022-03-15 DIAGNOSIS — R519 Headache, unspecified: Secondary | ICD-10-CM | POA: Diagnosis not present

## 2022-03-15 DIAGNOSIS — D61810 Antineoplastic chemotherapy induced pancytopenia: Secondary | ICD-10-CM | POA: Diagnosis not present

## 2022-03-15 DIAGNOSIS — N179 Acute kidney failure, unspecified: Secondary | ICD-10-CM | POA: Diagnosis not present

## 2022-03-15 DIAGNOSIS — D649 Anemia, unspecified: Secondary | ICD-10-CM | POA: Diagnosis not present

## 2022-03-15 DIAGNOSIS — N189 Chronic kidney disease, unspecified: Secondary | ICD-10-CM | POA: Diagnosis not present

## 2022-03-15 DIAGNOSIS — H6093 Unspecified otitis externa, bilateral: Secondary | ICD-10-CM | POA: Diagnosis not present

## 2022-03-15 DIAGNOSIS — H6693 Otitis media, unspecified, bilateral: Secondary | ICD-10-CM | POA: Diagnosis not present

## 2022-03-15 DIAGNOSIS — R195 Other fecal abnormalities: Secondary | ICD-10-CM | POA: Diagnosis not present

## 2022-03-15 DIAGNOSIS — D696 Thrombocytopenia, unspecified: Secondary | ICD-10-CM | POA: Diagnosis not present

## 2022-03-15 DIAGNOSIS — R1084 Generalized abdominal pain: Secondary | ICD-10-CM | POA: Diagnosis not present

## 2022-03-15 DIAGNOSIS — Z95828 Presence of other vascular implants and grafts: Secondary | ICD-10-CM | POA: Diagnosis not present

## 2022-03-15 DIAGNOSIS — D7589 Other specified diseases of blood and blood-forming organs: Secondary | ICD-10-CM | POA: Diagnosis not present

## 2022-03-15 DIAGNOSIS — K602 Anal fissure, unspecified: Secondary | ICD-10-CM | POA: Diagnosis not present

## 2022-03-15 DIAGNOSIS — R438 Other disturbances of smell and taste: Secondary | ICD-10-CM | POA: Diagnosis not present

## 2022-03-15 DIAGNOSIS — R160 Hepatomegaly, not elsewhere classified: Secondary | ICD-10-CM | POA: Diagnosis not present

## 2022-03-15 DIAGNOSIS — Z79899 Other long term (current) drug therapy: Secondary | ICD-10-CM | POA: Diagnosis not present

## 2022-03-15 DIAGNOSIS — Z5111 Encounter for antineoplastic chemotherapy: Secondary | ICD-10-CM | POA: Diagnosis not present

## 2022-03-15 DIAGNOSIS — D61818 Other pancytopenia: Secondary | ICD-10-CM | POA: Diagnosis not present

## 2022-03-15 DIAGNOSIS — K5289 Other specified noninfective gastroenteritis and colitis: Secondary | ICD-10-CM | POA: Diagnosis not present

## 2022-03-15 DIAGNOSIS — K529 Noninfective gastroenteritis and colitis, unspecified: Secondary | ICD-10-CM | POA: Diagnosis not present

## 2022-03-15 DIAGNOSIS — R140 Abdominal distension (gaseous): Secondary | ICD-10-CM | POA: Diagnosis not present

## 2022-03-15 DIAGNOSIS — D709 Neutropenia, unspecified: Secondary | ICD-10-CM | POA: Diagnosis not present

## 2022-03-15 DIAGNOSIS — C9200 Acute myeloblastic leukemia, not having achieved remission: Secondary | ICD-10-CM | POA: Diagnosis not present

## 2022-03-15 LAB — METHYLMALONIC ACID, SERUM: Methylmalonic Acid, Quantitative: 77 nmol/L (ref 0–378)

## 2022-04-09 DIAGNOSIS — C9200 Acute myeloblastic leukemia, not having achieved remission: Secondary | ICD-10-CM | POA: Diagnosis not present

## 2022-04-09 DIAGNOSIS — D61818 Other pancytopenia: Secondary | ICD-10-CM | POA: Diagnosis not present

## 2022-04-12 DIAGNOSIS — D61818 Other pancytopenia: Secondary | ICD-10-CM | POA: Diagnosis not present

## 2022-04-12 DIAGNOSIS — C9200 Acute myeloblastic leukemia, not having achieved remission: Secondary | ICD-10-CM | POA: Diagnosis not present

## 2022-04-15 ENCOUNTER — Other Ambulatory Visit (HOSPITAL_COMMUNITY): Payer: Self-pay

## 2022-04-15 DIAGNOSIS — C9200 Acute myeloblastic leukemia, not having achieved remission: Secondary | ICD-10-CM | POA: Diagnosis not present

## 2022-04-15 DIAGNOSIS — D61818 Other pancytopenia: Secondary | ICD-10-CM | POA: Diagnosis not present

## 2022-04-16 ENCOUNTER — Other Ambulatory Visit (HOSPITAL_COMMUNITY): Payer: Self-pay

## 2022-04-18 ENCOUNTER — Other Ambulatory Visit: Payer: Self-pay

## 2022-04-18 ENCOUNTER — Other Ambulatory Visit (HOSPITAL_COMMUNITY): Payer: Self-pay

## 2022-04-18 MED ORDER — VENCLEXTA 100 MG PO TABS
400.0000 mg | ORAL_TABLET | Freq: Every day | ORAL | 0 refills | Status: AC
  Filled 2022-04-18 – 2022-05-05 (×6): qty 112, 28d supply, fill #0

## 2022-04-19 ENCOUNTER — Other Ambulatory Visit: Payer: Self-pay

## 2022-04-19 DIAGNOSIS — Z95828 Presence of other vascular implants and grafts: Secondary | ICD-10-CM | POA: Diagnosis not present

## 2022-04-19 DIAGNOSIS — C9200 Acute myeloblastic leukemia, not having achieved remission: Secondary | ICD-10-CM | POA: Diagnosis not present

## 2022-04-19 DIAGNOSIS — Z5111 Encounter for antineoplastic chemotherapy: Secondary | ICD-10-CM | POA: Diagnosis not present

## 2022-04-19 DIAGNOSIS — D61818 Other pancytopenia: Secondary | ICD-10-CM | POA: Diagnosis not present

## 2022-04-20 ENCOUNTER — Other Ambulatory Visit: Payer: Self-pay

## 2022-04-21 ENCOUNTER — Other Ambulatory Visit (HOSPITAL_COMMUNITY): Payer: Self-pay

## 2022-04-21 ENCOUNTER — Other Ambulatory Visit: Payer: Self-pay

## 2022-04-22 ENCOUNTER — Other Ambulatory Visit: Payer: Self-pay

## 2022-04-22 DIAGNOSIS — Z95828 Presence of other vascular implants and grafts: Secondary | ICD-10-CM | POA: Diagnosis not present

## 2022-04-22 DIAGNOSIS — R531 Weakness: Secondary | ICD-10-CM | POA: Diagnosis not present

## 2022-04-22 DIAGNOSIS — C9200 Acute myeloblastic leukemia, not having achieved remission: Secondary | ICD-10-CM | POA: Diagnosis not present

## 2022-04-22 DIAGNOSIS — I10 Essential (primary) hypertension: Secondary | ICD-10-CM | POA: Diagnosis not present

## 2022-04-22 DIAGNOSIS — D61818 Other pancytopenia: Secondary | ICD-10-CM | POA: Diagnosis not present

## 2022-04-22 DIAGNOSIS — C9290 Myeloid leukemia, unspecified, not having achieved remission: Secondary | ICD-10-CM | POA: Diagnosis not present

## 2022-04-22 DIAGNOSIS — Z5111 Encounter for antineoplastic chemotherapy: Secondary | ICD-10-CM | POA: Diagnosis not present

## 2022-04-25 ENCOUNTER — Other Ambulatory Visit: Payer: Self-pay

## 2022-04-26 ENCOUNTER — Other Ambulatory Visit: Payer: Self-pay

## 2022-04-26 DIAGNOSIS — J029 Acute pharyngitis, unspecified: Secondary | ICD-10-CM | POA: Diagnosis not present

## 2022-04-26 DIAGNOSIS — Z95828 Presence of other vascular implants and grafts: Secondary | ICD-10-CM | POA: Diagnosis not present

## 2022-04-26 DIAGNOSIS — Z5111 Encounter for antineoplastic chemotherapy: Secondary | ICD-10-CM | POA: Diagnosis not present

## 2022-04-26 DIAGNOSIS — D61818 Other pancytopenia: Secondary | ICD-10-CM | POA: Diagnosis not present

## 2022-04-26 DIAGNOSIS — C9200 Acute myeloblastic leukemia, not having achieved remission: Secondary | ICD-10-CM | POA: Diagnosis not present

## 2022-04-26 DIAGNOSIS — R519 Headache, unspecified: Secondary | ICD-10-CM | POA: Diagnosis not present

## 2022-04-26 DIAGNOSIS — D7589 Other specified diseases of blood and blood-forming organs: Secondary | ICD-10-CM | POA: Diagnosis not present

## 2022-04-27 DIAGNOSIS — Z95828 Presence of other vascular implants and grafts: Secondary | ICD-10-CM | POA: Diagnosis not present

## 2022-04-27 DIAGNOSIS — C9200 Acute myeloblastic leukemia, not having achieved remission: Secondary | ICD-10-CM | POA: Diagnosis not present

## 2022-04-27 DIAGNOSIS — J029 Acute pharyngitis, unspecified: Secondary | ICD-10-CM | POA: Diagnosis not present

## 2022-04-27 DIAGNOSIS — D61818 Other pancytopenia: Secondary | ICD-10-CM | POA: Diagnosis not present

## 2022-04-27 DIAGNOSIS — J392 Other diseases of pharynx: Secondary | ICD-10-CM | POA: Diagnosis not present

## 2022-04-27 DIAGNOSIS — Z5111 Encounter for antineoplastic chemotherapy: Secondary | ICD-10-CM | POA: Diagnosis not present

## 2022-04-28 ENCOUNTER — Other Ambulatory Visit (HOSPITAL_COMMUNITY): Payer: Self-pay

## 2022-04-29 ENCOUNTER — Other Ambulatory Visit: Payer: Self-pay

## 2022-05-03 ENCOUNTER — Other Ambulatory Visit: Payer: Self-pay

## 2022-05-03 DIAGNOSIS — C9200 Acute myeloblastic leukemia, not having achieved remission: Secondary | ICD-10-CM | POA: Diagnosis not present

## 2022-05-03 DIAGNOSIS — D61818 Other pancytopenia: Secondary | ICD-10-CM | POA: Diagnosis not present

## 2022-05-03 DIAGNOSIS — Z95828 Presence of other vascular implants and grafts: Secondary | ICD-10-CM | POA: Diagnosis not present

## 2022-05-03 DIAGNOSIS — Z5111 Encounter for antineoplastic chemotherapy: Secondary | ICD-10-CM | POA: Diagnosis not present

## 2022-05-04 ENCOUNTER — Other Ambulatory Visit: Payer: Self-pay

## 2022-05-04 DIAGNOSIS — Z01818 Encounter for other preprocedural examination: Secondary | ICD-10-CM | POA: Diagnosis not present

## 2022-05-04 DIAGNOSIS — Z7682 Awaiting organ transplant status: Secondary | ICD-10-CM | POA: Diagnosis not present

## 2022-05-04 DIAGNOSIS — C9200 Acute myeloblastic leukemia, not having achieved remission: Secondary | ICD-10-CM | POA: Diagnosis not present

## 2022-05-05 ENCOUNTER — Other Ambulatory Visit: Payer: Self-pay

## 2022-05-05 ENCOUNTER — Other Ambulatory Visit (HOSPITAL_COMMUNITY): Payer: Self-pay

## 2022-05-10 DIAGNOSIS — Z95828 Presence of other vascular implants and grafts: Secondary | ICD-10-CM | POA: Diagnosis not present

## 2022-05-10 DIAGNOSIS — Z5111 Encounter for antineoplastic chemotherapy: Secondary | ICD-10-CM | POA: Diagnosis not present

## 2022-05-10 DIAGNOSIS — D61818 Other pancytopenia: Secondary | ICD-10-CM | POA: Diagnosis not present

## 2022-05-10 DIAGNOSIS — C9200 Acute myeloblastic leukemia, not having achieved remission: Secondary | ICD-10-CM | POA: Diagnosis not present

## 2022-05-11 DIAGNOSIS — Z95828 Presence of other vascular implants and grafts: Secondary | ICD-10-CM | POA: Diagnosis not present

## 2022-05-11 DIAGNOSIS — Z5111 Encounter for antineoplastic chemotherapy: Secondary | ICD-10-CM | POA: Diagnosis not present

## 2022-05-11 DIAGNOSIS — D61818 Other pancytopenia: Secondary | ICD-10-CM | POA: Diagnosis not present

## 2022-05-11 DIAGNOSIS — C9200 Acute myeloblastic leukemia, not having achieved remission: Secondary | ICD-10-CM | POA: Diagnosis not present

## 2022-05-12 DIAGNOSIS — Z95828 Presence of other vascular implants and grafts: Secondary | ICD-10-CM | POA: Diagnosis not present

## 2022-05-12 DIAGNOSIS — D61818 Other pancytopenia: Secondary | ICD-10-CM | POA: Diagnosis not present

## 2022-05-12 DIAGNOSIS — C9200 Acute myeloblastic leukemia, not having achieved remission: Secondary | ICD-10-CM | POA: Diagnosis not present

## 2022-05-12 DIAGNOSIS — Z5111 Encounter for antineoplastic chemotherapy: Secondary | ICD-10-CM | POA: Diagnosis not present

## 2022-05-13 DIAGNOSIS — Z95828 Presence of other vascular implants and grafts: Secondary | ICD-10-CM | POA: Diagnosis not present

## 2022-05-13 DIAGNOSIS — Z5111 Encounter for antineoplastic chemotherapy: Secondary | ICD-10-CM | POA: Diagnosis not present

## 2022-05-13 DIAGNOSIS — D61818 Other pancytopenia: Secondary | ICD-10-CM | POA: Diagnosis not present

## 2022-05-13 DIAGNOSIS — C9200 Acute myeloblastic leukemia, not having achieved remission: Secondary | ICD-10-CM | POA: Diagnosis not present

## 2022-05-14 DIAGNOSIS — C9200 Acute myeloblastic leukemia, not having achieved remission: Secondary | ICD-10-CM | POA: Diagnosis not present

## 2022-05-14 DIAGNOSIS — Z95828 Presence of other vascular implants and grafts: Secondary | ICD-10-CM | POA: Diagnosis not present

## 2022-05-14 DIAGNOSIS — D61818 Other pancytopenia: Secondary | ICD-10-CM | POA: Diagnosis not present

## 2022-05-14 DIAGNOSIS — Z5111 Encounter for antineoplastic chemotherapy: Secondary | ICD-10-CM | POA: Diagnosis not present

## 2022-05-16 DIAGNOSIS — C9200 Acute myeloblastic leukemia, not having achieved remission: Secondary | ICD-10-CM | POA: Diagnosis not present

## 2022-05-16 DIAGNOSIS — Z95828 Presence of other vascular implants and grafts: Secondary | ICD-10-CM | POA: Diagnosis not present

## 2022-05-16 DIAGNOSIS — Z5111 Encounter for antineoplastic chemotherapy: Secondary | ICD-10-CM | POA: Diagnosis not present

## 2022-05-16 DIAGNOSIS — D61818 Other pancytopenia: Secondary | ICD-10-CM | POA: Diagnosis not present

## 2022-05-17 DIAGNOSIS — D61818 Other pancytopenia: Secondary | ICD-10-CM | POA: Diagnosis not present

## 2022-05-17 DIAGNOSIS — Z95828 Presence of other vascular implants and grafts: Secondary | ICD-10-CM | POA: Diagnosis not present

## 2022-05-17 DIAGNOSIS — Z5111 Encounter for antineoplastic chemotherapy: Secondary | ICD-10-CM | POA: Diagnosis not present

## 2022-05-17 DIAGNOSIS — C9200 Acute myeloblastic leukemia, not having achieved remission: Secondary | ICD-10-CM | POA: Diagnosis not present

## 2022-05-20 DIAGNOSIS — C9200 Acute myeloblastic leukemia, not having achieved remission: Secondary | ICD-10-CM | POA: Diagnosis not present

## 2022-05-24 ENCOUNTER — Emergency Department (HOSPITAL_BASED_OUTPATIENT_CLINIC_OR_DEPARTMENT_OTHER): Payer: Commercial Managed Care - PPO

## 2022-05-24 ENCOUNTER — Other Ambulatory Visit: Payer: Self-pay

## 2022-05-24 ENCOUNTER — Emergency Department (HOSPITAL_BASED_OUTPATIENT_CLINIC_OR_DEPARTMENT_OTHER)
Admission: EM | Admit: 2022-05-24 | Discharge: 2022-05-25 | Disposition: A | Discharge status: Other Acute Inpatient Hospital | Payer: Commercial Managed Care - PPO | Attending: Emergency Medicine | Admitting: Emergency Medicine

## 2022-05-24 ENCOUNTER — Encounter (HOSPITAL_BASED_OUTPATIENT_CLINIC_OR_DEPARTMENT_OTHER): Payer: Self-pay

## 2022-05-24 ENCOUNTER — Emergency Department (HOSPITAL_BASED_OUTPATIENT_CLINIC_OR_DEPARTMENT_OTHER): Payer: Commercial Managed Care - PPO | Admitting: Radiology

## 2022-05-24 DIAGNOSIS — I2693 Single subsegmental pulmonary embolism without acute cor pulmonale: Secondary | ICD-10-CM | POA: Insufficient documentation

## 2022-05-24 DIAGNOSIS — G4711 Idiopathic hypersomnia with long sleep time: Secondary | ICD-10-CM | POA: Diagnosis not present

## 2022-05-24 DIAGNOSIS — Z856 Personal history of leukemia: Secondary | ICD-10-CM | POA: Insufficient documentation

## 2022-05-24 DIAGNOSIS — C9200 Acute myeloblastic leukemia, not having achieved remission: Secondary | ICD-10-CM | POA: Diagnosis not present

## 2022-05-24 DIAGNOSIS — R7989 Other specified abnormal findings of blood chemistry: Secondary | ICD-10-CM | POA: Insufficient documentation

## 2022-05-24 DIAGNOSIS — R000 Tachycardia, unspecified: Secondary | ICD-10-CM | POA: Diagnosis not present

## 2022-05-24 DIAGNOSIS — D649 Anemia, unspecified: Secondary | ICD-10-CM | POA: Diagnosis not present

## 2022-05-24 DIAGNOSIS — K219 Gastro-esophageal reflux disease without esophagitis: Secondary | ICD-10-CM | POA: Diagnosis not present

## 2022-05-24 DIAGNOSIS — I214 Non-ST elevation (NSTEMI) myocardial infarction: Secondary | ICD-10-CM | POA: Diagnosis not present

## 2022-05-24 DIAGNOSIS — R0789 Other chest pain: Secondary | ICD-10-CM | POA: Diagnosis not present

## 2022-05-24 DIAGNOSIS — R778 Other specified abnormalities of plasma proteins: Secondary | ICD-10-CM | POA: Diagnosis not present

## 2022-05-24 DIAGNOSIS — R079 Chest pain, unspecified: Secondary | ICD-10-CM | POA: Diagnosis not present

## 2022-05-24 LAB — BASIC METABOLIC PANEL
Anion gap: 9 (ref 5–15)
BUN: 17 mg/dL (ref 6–20)
CO2: 26 mmol/L (ref 22–32)
Calcium: 9.5 mg/dL (ref 8.9–10.3)
Chloride: 103 mmol/L (ref 98–111)
Creatinine, Ser: 1.23 mg/dL (ref 0.61–1.24)
GFR, Estimated: >60 mL/min (ref >60)
Glucose, Bld: 110 mg/dL — ABNORMAL HIGH (ref 70–99)
Potassium: 3.7 mmol/L (ref 3.5–5.1)
Sodium: 138 mmol/L (ref 135–145)

## 2022-05-24 LAB — CBC
HCT: 29.9 % — ABNORMAL LOW (ref 39.0–52.0)
Hemoglobin: 9.7 g/dL — ABNORMAL LOW (ref 13.0–17.0)
MCH: 30.8 pg (ref 26.0–34.0)
MCHC: 32.4 g/dL (ref 30.0–36.0)
MCV: 94.9 fL (ref 80.0–100.0)
Platelets: 201 10*3/uL (ref 150–400)
RBC: 3.15 MIL/uL — ABNORMAL LOW (ref 4.22–5.81)
RDW: 24.1 % — ABNORMAL HIGH (ref 11.5–15.5)
WBC: 4.7 10*3/uL (ref 4.0–10.5)
nRBC: 0.6 % — ABNORMAL HIGH (ref 0.0–0.2)

## 2022-05-24 LAB — SEDIMENTATION RATE: Sed Rate: 65 mm/hr — ABNORMAL HIGH (ref 0–16)

## 2022-05-24 LAB — TROPONIN I (HIGH SENSITIVITY)
Troponin I (High Sensitivity): 352 ng/L (ref <18)
Troponin I (High Sensitivity): 352 ng/L (ref <18)

## 2022-05-24 MED ORDER — IOHEXOL 350 MG/ML SOLN
100.0000 mL | Freq: Once | INTRAVENOUS | Status: AC | PRN
  Administered 2022-05-24: 75 mL via INTRAVENOUS

## 2022-05-24 MED ORDER — ASPIRIN 325 MG PO TABS
325.0000 mg | ORAL_TABLET | Freq: Every day | ORAL | Status: DC
  Administered 2022-05-24: 325 mg via ORAL
  Filled 2022-05-24: qty 1

## 2022-05-24 MED ORDER — HEPARIN BOLUS VIA INFUSION
4000.0000 [IU] | Freq: Once | INTRAVENOUS | Status: AC
  Administered 2022-05-24: 4000 [IU] via INTRAVENOUS

## 2022-05-24 MED ORDER — NITROGLYCERIN 0.4 MG SL SUBL: 0.4000 mg | SUBLINGUAL_TABLET | SUBLINGUAL | Status: DC | PRN

## 2022-05-24 MED ORDER — HEPARIN (PORCINE) 25000 UT/250ML-% IV SOLN
950.0000 [IU]/h | INTRAVENOUS | Status: DC
  Administered 2022-05-24: 950 [IU]/h via INTRAVENOUS
  Filled 2022-05-24: qty 250

## 2022-05-24 NOTE — ED Notes (Signed)
CRITICAL VALUE STICKER  CRITICAL VALUE: Trop [id number]  RECEIVER (on-site recipient of call): Valere Dross, RN  DATE & TIME NOTIFIED:   MESSENGER (representative from lab):  MD NOTIFIED: Dr. Karene Fry  TIME OF NOTIFICATION:1857  RESPONSE:

## 2022-05-24 NOTE — Progress Notes (Signed)
ANTICOAGULATION CONSULT NOTE - Initial Consult  Pharmacy Consult for heparin Indication: chest pain/ACS  No Known Allergies  Patient Measurements: Height: 6' (182.9 cm) Weight: 79.4 kg (175 lb) IBW/kg (Calculated) : 77.6 Heparin Dosing Weight: 79.4 kg  Vital Signs: Temp: 98.6 F (37 C) (05/07 1810) Temp Source: Oral (05/07 1810) BP: 123/78 (05/07 1810) Pulse Rate: 88 (05/07 1810)  Labs: Recent Labs    05/24/22 1801  HGB 9.7*  HCT 29.9*  PLT 201  CREATININE 1.23  TROPONINIHS 352*    Estimated Creatinine Clearance: 75.4 mL/min (by C-G formula based on SCr of 1.23 mg/dL).   Medical History: Past Medical History:  Diagnosis Date   History of anal fissures 03/08/2022   History of H. pylori infection 03/08/2022   Subacromial bursitis of left shoulder joint 03/08/2022   Tear of right supraspinatus tendon 03/08/2022    Medications:  (Not in a hospital admission)   Assessment: Marc Nielsen presents today with chest pain that started yesterday. He is undergoing chemotherapy for AML. He is not on anticoagulation prior to admission.   Goal of Therapy:  Heparin level 0.3-0.7 units/ml Monitor platelets by anticoagulation protocol: Yes   Plan:  Give 4,000 units bolus x 1 Start heparin infusion at 950 units/hr Check anti-Xa level in 6 hours and daily while on heparin Continue to monitor H&H and platelets  Blane Ohara, PharmD  PGY1 Pharmacy Resident

## 2022-05-24 NOTE — Discharge Instructions (Addendum)
You are being transferred to [facility] for inpatient management of a pulmonary embolism which is a blood clot in your lungs.  You also have evidence of heart damage with an elevated troponin.

## 2022-05-24 NOTE — ED Provider Notes (Signed)
[place] EMERGENCY DEPARTMENT AT [facility] Provider Note   CSN: [id number] Arrival date & time: 05/24/22  1750     History  Chief Complaint  Patient presents with   Chest Pain    Marc URSIN is a 52 y.o. male.   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  The patient states that he noticed it when taking a deep breath.  He is currently undergoing treatment for AML via chemotherapy at [facility].  He states that he has had a dull chest discomfort, most noticeable when taking a deep breath.  He denies any cough, fever or chills.  He denies any history of DVT or PE.  He denies any diaphoresis, nausea or vomiting.  No abdominal discomfort.  Pain radiates slightly to the back, no ripping or tearing component, no migratory chest pain.  At rest he barely notices it.  Home Medications Prior to Admission medications   Medication Sig Start Date End Date Taking? Authorizing Provider  docusate sodium (COLACE) 100 MG capsule Take 100-200 mg by mouth daily.    [provider]  Ferrous Fumarate (FERROCITE PO) Take 1 tablet by mouth daily with breakfast.    [provider]  TYLENOL 500 MG tablet Take 500-1,000 mg by mouth every 6 (six) hours as needed for mild pain or headache.    [provider]  venetoclax (VENCLEXTA) 100 MG tablet Take 4 tablets (400 mg total) by mouth daily. 04/15/22         Allergies    Patient has no known allergies.    Review of Systems   Review of Systems  Cardiovascular:  Positive for chest pain.    Physical Exam Updated Vital Signs BP 101/65   Pulse 95   Temp 98 F (36.7 C)   Resp 19   Ht 6' (1.829 m)   Wt 79.4 kg   SpO2 99%   BMI 23.73 kg/m  Physical Exam Vitals and nursing note reviewed.  Constitutional:      General: He is not in acute distress.    Appearance: He is  well-developed.  HENT:     Head: Normocephalic and atraumatic.  Eyes:     Conjunctiva/sclera: Conjunctivae normal.  Cardiovascular:     Rate and Rhythm: Regular rhythm. Tachycardia present.     Pulses: Normal pulses.  Pulmonary:     Effort: Pulmonary effort is normal. No respiratory distress.     Breath sounds: Normal breath sounds.  Abdominal:     Palpations: Abdomen is soft.     Tenderness: There is no abdominal tenderness.  Musculoskeletal:        General: No swelling.     Cervical back: Neck supple.     Right lower leg: No edema.     Left lower leg: No edema.  Skin:    General: Skin is warm and dry.     Capillary Refill: Capillary refill takes less than 2 seconds.  Neurological:     Mental Status: He is alert.  Psychiatric:        Mood and Affect: Mood normal.     ED Results / Procedures / Treatments   Labs (all labs ordered are listed, but only abnormal results are displayed) Labs Reviewed  BASIC METABOLIC PANEL - Abnormal; Notable for the following components:      Result Value   Glucose, Bld  110 (*)    All other components within normal limits  CBC - Abnormal; Notable for the following components:   RBC 3.15 (*)    Hemoglobin 9.7 (*)    HCT 29.9 (*)    RDW 24.1 (*)    nRBC 0.6 (*)    All other components within normal limits  SEDIMENTATION RATE - Abnormal; Notable for the following components:   Sed Rate 65 (*)    All other components within normal limits  TROPONIN I (HIGH SENSITIVITY) - Abnormal; Notable for the following components:   Troponin I (High Sensitivity) 352 (*)    All other components within normal limits  TROPONIN I (HIGH SENSITIVITY) - Abnormal; Notable for the following components:   Troponin I (High Sensitivity) 352 (*)    All other components within normal limits  HEPARIN LEVEL (UNFRACTIONATED)  C-REACTIVE PROTEIN    EKG EKG Interpretation  Date/Time:  Tuesday May 24 2022 18:02:26 EDT Ventricular Rate:  90 PR Interval:  130 QRS  Duration: 84 QT Interval:  366 QTC Calculation: 447 R Axis:   60 Text Interpretation: Normal sinus rhythm Possible Anterior infarct , age undetermined Abnormal ECG No previous ECGs available Confirmed by Ernie Avena ([id number]) on 05/24/2022 6:21:38 PM  Radiology CT Angio Chest PE W and/or Wo Contrast  Result Date: 05/24/2022 CLINICAL DATA:  Concern for pulmonary edema. EXAM: CT ANGIOGRAPHY CHEST WITH CONTRAST TECHNIQUE: Multidetector CT imaging of the chest was performed using the standard protocol during bolus administration of intravenous contrast. Multiplanar CT image reconstructions and MIPs were obtained to evaluate the vascular anatomy. RADIATION DOSE REDUCTION: This exam was performed according to the departmental dose-optimization program which includes automated exposure control, adjustment of the mA and/or kV according to patient size and/or use of iterative reconstruction technique. CONTRAST:  75mL OMNIPAQUE IOHEXOL 350 MG/ML SOLN COMPARISON:  Chest radiograph dated 05/24/2022. FINDINGS: Cardiovascular: There is no cardiomegaly or pericardial effusion. The thoracic aorta is unremarkable. Small subsegmental pulmonary artery embolus in the left lower lobe (166/5). No CT evidence of right heart straining. Right sided PICC with tip close to the cavoatrial junction. Mediastinum/Nodes: No hilar or mediastinal adenopathy. The esophagus is grossly unremarkable. No mediastinal fluid collection. Lungs/Pleura: No focal consolidation, pleural effusion, or pneumothorax. The central airways are patent. Upper Abdomen: Scattered colonic diverticula.  No acute findings. Musculoskeletal: No acute osseous pathology. Review of the MIP images confirms the above findings. IMPRESSION: Small subsegmental pulmonary artery embolus in the left lower lobe. No CT evidence of right heart straining. These results were called by telephone at the time of interpretation on 05/24/2022 at 7:59 pm to provider [facility] , who verbally  acknowledged these results. Electronically Signed   By: Elgie Collard M.D.   On: 05/24/2022 20:10   DG Chest 2 View  Result Date: 05/24/2022 CLINICAL DATA:  Pleuritic chest pain beginning yesterday. Undergoing chemotherapy for acute myelogenous leukemia. EXAM: CHEST - 2 VIEW COMPARISON:  None Available. FINDINGS: The heart size and mediastinal contours are within normal limits. Right arm PICC line is seen in appropriate position. Both lungs are clear. The visualized skeletal structures are unremarkable. IMPRESSION: No active cardiopulmonary disease. Right arm PICC line in appropriate position. Electronically Signed   By: Danae Orleans M.D.   On: 05/24/2022 18:50    Procedures .Critical Care  Performed by: Ernie Avena, MD Authorized by: Ernie Avena, MD   Critical care provider statement:    Critical care time (minutes):  30   Critical care was time spent  personally by me on the following activities:  Development of treatment plan with patient or surrogate, discussions with consultants, evaluation of patient's response to treatment, examination of patient, ordering and review of laboratory studies, ordering and review of radiographic studies, ordering and performing treatments and interventions, pulse oximetry, re-evaluation of patient's condition and review of old charts   Care discussed with: accepting provider at another facility       Medications Ordered in ED Medications  aspirin tablet 325 mg (325 mg Oral Given 05/24/22 1958)  heparin ADULT infusion 100 units/mL (25000 units/240mL) (950 Units/hr Intravenous New Bag/Given 05/24/22 2003)  nitroGLYCERIN (NITROSTAT) SL tablet 0.4 mg (has no administration in time range)  heparin bolus via infusion 4,000 Units (4,000 Units Intravenous Bolus from Bag 05/24/22 2002)  iohexol (OMNIPAQUE) 350 MG/ML injection 100 mL (75 mLs Intravenous Contrast Given 05/24/22 1935)    ED Course/ Medical Decision Making/ A&P Clinical Course as of 05/24/22 2346   Tue May 24, 2022  1857 Troponin I (High Sensitivity)(!!): 352 [JL]    Clinical Course User Index [JL] Ernie Avena, MD                             Medical Decision Making Amount and/or Complexity of Data Reviewed Labs: ordered. Decision-making details documented in ED Course. Radiology: ordered.  Risk OTC drugs. Prescription drug management.     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  The patient states that he noticed it when taking a deep breath.  He is currently undergoing treatment for AML via chemotherapy at [facility].  He states that he has had a dull chest discomfort, most noticeable when taking a deep breath.  He denies any cough, fever or chills.  He denies any history of DVT or PE.  He denies any diaphoresis, nausea or vomiting.  No abdominal discomfort.  Pain radiates slightly to the back, no ripping or tearing component, no migratory chest pain.  At rest he barely notices it.  On arrival, the patient was afebrile, not tachycardic or tachypneic, BP 123/78, saturating 100% on room air.  On my evaluation, sinus tachycardia noted on cardiac telemetry.  Physical exam generally unremarkable.  Patient chest pain not ripping or tearing.  Intact pulses symmetrically bilaterally, no lower extremity edema, lungs clear to auscultation.  Differential diagnosis includes PE, myocarditis, ACS, pneumonia, viral infection.  Initial EKG revealed sinus rhythm, ventricular rate 9 0, age-indeterminate anterior infarct, no STEMI, no ST segment elevations or T wave inversions.  Some T wave flattening noted.  CXR: IMPRESSION:  No active cardiopulmonary disease.    Right arm PICC line in appropriate position.   The patient's initial cardiac troponin resulted elevated at 352.  A BMP was unremarkable and a CBC revealed no leukocytosis, mild anemia to 9.7.  Patient denies any  cough, endorses pleuritic chest discomfort.  Concern for PE.  CTA PE imaging was performed: IMPRESSION:  Small subsegmental pulmonary artery embolus in the left lower lobe.  No CT evidence of right heart straining.    These results were called by telephone at the time of interpretation  on 05/24/2022 at 7:59 pm to provider [facility] , who verbally  acknowledged these results.    No evidence of right heart strain noted on CT imaging, small segment subsegmental pulmonary embolus was noted in the left lower lobe.  Repeat  troponin was flat at 352.  The patient was started on heparin IV and administered an aspirin tablet.  On repeat assessment, his chest pain had resolved.  He was resting comfortably in bed, vitally stable.  He did request transfer to [facility] if inpatient were required.  I engaged the [facility] physician access line and spoke with Dr. Malachi Bonds who accepted the patient in transfer.  EMTALA completed and patient and family updated regarding the plan of care.  Will plan for cardiology consultation inpatient at [facility].    Final Clinical Impression(s) / ED Diagnoses Final diagnoses:  Single subsegmental pulmonary embolism without acute cor pulmonale (HCC)  Elevated troponin    Rx / DC Orders ED Discharge Orders     None         Ernie Avena, MD 05/24/22 2346

## 2022-05-24 NOTE — ED Triage Notes (Signed)
Patient here POV from Home.  Endorses CP that began yesterday. Present with Deep inspiration. Currently under Chemotherapy for AML. No Fever. No Cough. No N/V/D. No SOB.   NAD Noted during Triage. A&Ox4. Gcs 15. Ambulatory.

## 2022-05-25 ENCOUNTER — Other Ambulatory Visit (HOSPITAL_COMMUNITY): Payer: Self-pay

## 2022-05-25 DIAGNOSIS — K5900 Constipation, unspecified: Secondary | ICD-10-CM | POA: Diagnosis not present

## 2022-05-25 DIAGNOSIS — I21A1 Myocardial infarction type 2: Secondary | ICD-10-CM | POA: Diagnosis not present

## 2022-05-25 DIAGNOSIS — R0789 Other chest pain: Secondary | ICD-10-CM | POA: Diagnosis not present

## 2022-05-25 DIAGNOSIS — D72819 Decreased white blood cell count, unspecified: Secondary | ICD-10-CM | POA: Diagnosis not present

## 2022-05-25 DIAGNOSIS — G4700 Insomnia, unspecified: Secondary | ICD-10-CM | POA: Diagnosis not present

## 2022-05-25 DIAGNOSIS — K219 Gastro-esophageal reflux disease without esophagitis: Secondary | ICD-10-CM | POA: Diagnosis not present

## 2022-05-25 DIAGNOSIS — I214 Non-ST elevation (NSTEMI) myocardial infarction: Secondary | ICD-10-CM | POA: Diagnosis not present

## 2022-05-25 DIAGNOSIS — I2510 Atherosclerotic heart disease of native coronary artery without angina pectoris: Secondary | ICD-10-CM | POA: Diagnosis not present

## 2022-05-25 DIAGNOSIS — R609 Edema, unspecified: Secondary | ICD-10-CM | POA: Diagnosis not present

## 2022-05-25 DIAGNOSIS — I2693 Single subsegmental pulmonary embolism without acute cor pulmonale: Secondary | ICD-10-CM | POA: Diagnosis not present

## 2022-05-25 DIAGNOSIS — C9200 Acute myeloblastic leukemia, not having achieved remission: Secondary | ICD-10-CM | POA: Diagnosis not present

## 2022-05-25 DIAGNOSIS — I499 Cardiac arrhythmia, unspecified: Secondary | ICD-10-CM | POA: Diagnosis not present

## 2022-05-25 DIAGNOSIS — I517 Cardiomegaly: Secondary | ICD-10-CM | POA: Diagnosis not present

## 2022-05-25 DIAGNOSIS — Z79891 Long term (current) use of opiate analgesic: Secondary | ICD-10-CM | POA: Diagnosis not present

## 2022-05-25 DIAGNOSIS — Z79899 Other long term (current) drug therapy: Secondary | ICD-10-CM | POA: Diagnosis not present

## 2022-05-25 DIAGNOSIS — Z91013 Allergy to seafood: Secondary | ICD-10-CM | POA: Diagnosis not present

## 2022-05-25 LAB — C-REACTIVE PROTEIN: CRP: 1.1 mg/dL — ABNORMAL HIGH (ref <1.0)

## 2022-05-25 MED ORDER — ACYCLOVIR 400 MG PO TABS
400.0000 mg | ORAL_TABLET | Freq: Two times a day (BID) | ORAL | 0 refills | Status: DC
  Filled 2022-05-25 – 2022-07-05 (×2): qty 180, 90d supply, fill #0

## 2022-05-25 MED ORDER — ENOXAPARIN SODIUM 80 MG/0.8ML IJ SOSY
80.0000 mg | PREFILLED_SYRINGE | Freq: Two times a day (BID) | INTRAMUSCULAR | 0 refills | Status: DC
  Filled 2022-05-25: qty 48, 30d supply, fill #0

## 2022-05-27 DIAGNOSIS — C9200 Acute myeloblastic leukemia, not having achieved remission: Secondary | ICD-10-CM | POA: Diagnosis not present

## 2022-05-31 ENCOUNTER — Other Ambulatory Visit (HOSPITAL_COMMUNITY): Payer: Self-pay

## 2022-05-31 DIAGNOSIS — Z9484 Stem cells transplant status: Secondary | ICD-10-CM | POA: Diagnosis not present

## 2022-05-31 DIAGNOSIS — Z01818 Encounter for other preprocedural examination: Secondary | ICD-10-CM | POA: Diagnosis not present

## 2022-05-31 DIAGNOSIS — C9200 Acute myeloblastic leukemia, not having achieved remission: Secondary | ICD-10-CM | POA: Diagnosis not present

## 2022-05-31 DIAGNOSIS — Z7682 Awaiting organ transplant status: Secondary | ICD-10-CM | POA: Diagnosis not present

## 2022-05-31 DIAGNOSIS — Z79899 Other long term (current) drug therapy: Secondary | ICD-10-CM | POA: Diagnosis not present

## 2022-06-01 DIAGNOSIS — Z52001 Unspecified donor, stem cells: Secondary | ICD-10-CM | POA: Diagnosis not present

## 2022-06-03 DIAGNOSIS — C9200 Acute myeloblastic leukemia, not having achieved remission: Secondary | ICD-10-CM | POA: Diagnosis not present

## 2022-06-03 DIAGNOSIS — Z01818 Encounter for other preprocedural examination: Secondary | ICD-10-CM | POA: Diagnosis not present

## 2022-06-06 ENCOUNTER — Other Ambulatory Visit (HOSPITAL_COMMUNITY): Payer: Self-pay

## 2022-06-07 DIAGNOSIS — C9200 Acute myeloblastic leukemia, not having achieved remission: Secondary | ICD-10-CM | POA: Diagnosis not present

## 2022-06-10 ENCOUNTER — Other Ambulatory Visit (HOSPITAL_COMMUNITY): Payer: Self-pay

## 2022-06-10 DIAGNOSIS — C9200 Acute myeloblastic leukemia, not having achieved remission: Secondary | ICD-10-CM | POA: Diagnosis not present

## 2022-06-14 DIAGNOSIS — C9200 Acute myeloblastic leukemia, not having achieved remission: Secondary | ICD-10-CM | POA: Diagnosis not present

## 2022-06-15 ENCOUNTER — Other Ambulatory Visit (HOSPITAL_COMMUNITY): Payer: Self-pay

## 2022-06-17 ENCOUNTER — Other Ambulatory Visit: Payer: Self-pay

## 2022-06-17 ENCOUNTER — Other Ambulatory Visit (HOSPITAL_COMMUNITY): Payer: Self-pay

## 2022-06-17 DIAGNOSIS — Z79899 Other long term (current) drug therapy: Secondary | ICD-10-CM | POA: Diagnosis not present

## 2022-06-17 DIAGNOSIS — D701 Agranulocytosis secondary to cancer chemotherapy: Secondary | ICD-10-CM | POA: Diagnosis not present

## 2022-06-17 DIAGNOSIS — Z7969 Long term (current) use of other immunomodulators and immunosuppressants: Secondary | ICD-10-CM | POA: Diagnosis not present

## 2022-06-17 DIAGNOSIS — D696 Thrombocytopenia, unspecified: Secondary | ICD-10-CM | POA: Diagnosis not present

## 2022-06-17 DIAGNOSIS — T80212A Local infection due to central venous catheter, initial encounter: Secondary | ICD-10-CM | POA: Diagnosis not present

## 2022-06-17 DIAGNOSIS — R519 Headache, unspecified: Secondary | ICD-10-CM | POA: Diagnosis not present

## 2022-06-17 DIAGNOSIS — I2693 Single subsegmental pulmonary embolism without acute cor pulmonale: Secondary | ICD-10-CM | POA: Diagnosis not present

## 2022-06-17 DIAGNOSIS — T451X5A Adverse effect of antineoplastic and immunosuppressive drugs, initial encounter: Secondary | ICD-10-CM | POA: Diagnosis not present

## 2022-06-17 DIAGNOSIS — C9200 Acute myeloblastic leukemia, not having achieved remission: Secondary | ICD-10-CM | POA: Diagnosis not present

## 2022-06-17 MED ORDER — VENCLEXTA 100 MG PO TABS
ORAL_TABLET | ORAL | 11 refills | Status: AC
  Filled 2022-06-17: qty 84, 21d supply, fill #0

## 2022-06-17 MED ORDER — VENCLEXTA 100 MG PO TABS: ORAL_TABLET | ORAL | 11 refills | Status: DC

## 2022-06-20 ENCOUNTER — Other Ambulatory Visit: Payer: Self-pay

## 2022-06-21 DIAGNOSIS — D61818 Other pancytopenia: Secondary | ICD-10-CM | POA: Diagnosis not present

## 2022-06-21 DIAGNOSIS — C9200 Acute myeloblastic leukemia, not having achieved remission: Secondary | ICD-10-CM | POA: Diagnosis not present

## 2022-06-21 DIAGNOSIS — I2693 Single subsegmental pulmonary embolism without acute cor pulmonale: Secondary | ICD-10-CM | POA: Diagnosis not present

## 2022-06-21 DIAGNOSIS — Z5111 Encounter for antineoplastic chemotherapy: Secondary | ICD-10-CM | POA: Diagnosis not present

## 2022-06-22 DIAGNOSIS — D61818 Other pancytopenia: Secondary | ICD-10-CM | POA: Diagnosis not present

## 2022-06-22 DIAGNOSIS — C9200 Acute myeloblastic leukemia, not having achieved remission: Secondary | ICD-10-CM | POA: Diagnosis not present

## 2022-06-22 DIAGNOSIS — Z5111 Encounter for antineoplastic chemotherapy: Secondary | ICD-10-CM | POA: Diagnosis not present

## 2022-06-23 DIAGNOSIS — Z5111 Encounter for antineoplastic chemotherapy: Secondary | ICD-10-CM | POA: Diagnosis not present

## 2022-06-23 DIAGNOSIS — D61818 Other pancytopenia: Secondary | ICD-10-CM | POA: Diagnosis not present

## 2022-06-23 DIAGNOSIS — C9200 Acute myeloblastic leukemia, not having achieved remission: Secondary | ICD-10-CM | POA: Diagnosis not present

## 2022-06-24 DIAGNOSIS — D61818 Other pancytopenia: Secondary | ICD-10-CM | POA: Diagnosis not present

## 2022-06-24 DIAGNOSIS — Z5111 Encounter for antineoplastic chemotherapy: Secondary | ICD-10-CM | POA: Diagnosis not present

## 2022-06-24 DIAGNOSIS — C9200 Acute myeloblastic leukemia, not having achieved remission: Secondary | ICD-10-CM | POA: Diagnosis not present

## 2022-06-25 DIAGNOSIS — C9200 Acute myeloblastic leukemia, not having achieved remission: Secondary | ICD-10-CM | POA: Diagnosis not present

## 2022-06-25 DIAGNOSIS — Z5111 Encounter for antineoplastic chemotherapy: Secondary | ICD-10-CM | POA: Diagnosis not present

## 2022-06-25 DIAGNOSIS — D61818 Other pancytopenia: Secondary | ICD-10-CM | POA: Diagnosis not present

## 2022-06-27 DIAGNOSIS — C9200 Acute myeloblastic leukemia, not having achieved remission: Secondary | ICD-10-CM | POA: Diagnosis not present

## 2022-06-27 DIAGNOSIS — D61818 Other pancytopenia: Secondary | ICD-10-CM | POA: Diagnosis not present

## 2022-06-27 DIAGNOSIS — Z5111 Encounter for antineoplastic chemotherapy: Secondary | ICD-10-CM | POA: Diagnosis not present

## 2022-06-28 DIAGNOSIS — C9200 Acute myeloblastic leukemia, not having achieved remission: Secondary | ICD-10-CM | POA: Diagnosis not present

## 2022-06-28 DIAGNOSIS — D61818 Other pancytopenia: Secondary | ICD-10-CM | POA: Diagnosis not present

## 2022-06-28 DIAGNOSIS — Z5111 Encounter for antineoplastic chemotherapy: Secondary | ICD-10-CM | POA: Diagnosis not present

## 2022-06-30 DIAGNOSIS — K219 Gastro-esophageal reflux disease without esophagitis: Secondary | ICD-10-CM | POA: Diagnosis not present

## 2022-06-30 DIAGNOSIS — D539 Nutritional anemia, unspecified: Secondary | ICD-10-CM | POA: Diagnosis not present

## 2022-06-30 DIAGNOSIS — C9290 Myeloid leukemia, unspecified, not having achieved remission: Secondary | ICD-10-CM | POA: Diagnosis not present

## 2022-07-01 DIAGNOSIS — C9200 Acute myeloblastic leukemia, not having achieved remission: Secondary | ICD-10-CM | POA: Diagnosis not present

## 2022-07-02 ENCOUNTER — Other Ambulatory Visit: Payer: Self-pay

## 2022-07-02 DIAGNOSIS — E876 Hypokalemia: Secondary | ICD-10-CM | POA: Diagnosis not present

## 2022-07-02 DIAGNOSIS — C9200 Acute myeloblastic leukemia, not having achieved remission: Secondary | ICD-10-CM | POA: Diagnosis not present

## 2022-07-02 DIAGNOSIS — R1011 Right upper quadrant pain: Secondary | ICD-10-CM | POA: Diagnosis not present

## 2022-07-02 NOTE — ED Triage Notes (Signed)
Pt with hx of leukemia who is undergoing outpatient chemotherapy is here for upper abdominal pain which began yesterday.  Pt states that pain increases with tough to upper abdomen.  LBM was yesterday or the day before.  No n/v with this, no fever

## 2022-07-03 ENCOUNTER — Emergency Department (HOSPITAL_BASED_OUTPATIENT_CLINIC_OR_DEPARTMENT_OTHER)
Admission: EM | Admit: 2022-07-03 | Discharge: 2022-07-03 | Disposition: A | Discharge status: Home / Self Care | Payer: Commercial Managed Care - PPO | Attending: Emergency Medicine | Admitting: Emergency Medicine

## 2022-07-03 ENCOUNTER — Encounter (HOSPITAL_BASED_OUTPATIENT_CLINIC_OR_DEPARTMENT_OTHER): Payer: Self-pay | Admitting: *Deleted

## 2022-07-03 ENCOUNTER — Emergency Department (HOSPITAL_BASED_OUTPATIENT_CLINIC_OR_DEPARTMENT_OTHER): Payer: Commercial Managed Care - PPO

## 2022-07-03 DIAGNOSIS — C9200 Acute myeloblastic leukemia, not having achieved remission: Secondary | ICD-10-CM

## 2022-07-03 DIAGNOSIS — K5730 Diverticulosis of large intestine without perforation or abscess without bleeding: Secondary | ICD-10-CM | POA: Diagnosis not present

## 2022-07-03 DIAGNOSIS — R1011 Right upper quadrant pain: Secondary | ICD-10-CM

## 2022-07-03 DIAGNOSIS — R109 Unspecified abdominal pain: Secondary | ICD-10-CM | POA: Diagnosis not present

## 2022-07-03 LAB — CBC
HCT: 24.6 % — ABNORMAL LOW (ref 39.0–52.0)
Hemoglobin: 8.6 g/dL — ABNORMAL LOW (ref 13.0–17.0)
MCH: 33.5 pg (ref 26.0–34.0)
MCHC: 35 g/dL (ref 30.0–36.0)
MCV: 95.7 fL (ref 80.0–100.0)
Platelets: 62 10*3/uL — ABNORMAL LOW (ref 150–400)
RBC: 2.57 MIL/uL — ABNORMAL LOW (ref 4.22–5.81)
RDW: 22 % — ABNORMAL HIGH (ref 11.5–15.5)
WBC: 2.4 10*3/uL — ABNORMAL LOW (ref 4.0–10.5)
nRBC: 2.1 % — ABNORMAL HIGH (ref 0.0–0.2)

## 2022-07-03 LAB — URINALYSIS, ROUTINE W REFLEX MICROSCOPIC
Bilirubin Urine: NEGATIVE
Glucose, UA: NEGATIVE mg/dL
Ketones, ur: NEGATIVE mg/dL
Leukocytes,Ua: NEGATIVE
Nitrite: NEGATIVE
Protein, ur: 100 mg/dL — AB
Specific Gravity, Urine: 1.015 (ref 1.005–1.030)
pH: 6 (ref 5.0–8.0)

## 2022-07-03 LAB — COMPREHENSIVE METABOLIC PANEL
ALT: 27 U/L (ref 0–44)
AST: 45 U/L — ABNORMAL HIGH (ref 15–41)
Albumin: 4.5 g/dL (ref 3.5–5.0)
Alkaline Phosphatase: 43 U/L (ref 38–126)
Anion gap: 11 (ref 5–15)
BUN: 12 mg/dL (ref 6–20)
CO2: 26 mmol/L (ref 22–32)
Calcium: 9.9 mg/dL (ref 8.9–10.3)
Chloride: 100 mmol/L (ref 98–111)
Creatinine, Ser: 1.48 mg/dL — ABNORMAL HIGH (ref 0.61–1.24)
GFR, Estimated: 56 mL/min — ABNORMAL LOW (ref >60)
Glucose, Bld: 118 mg/dL — ABNORMAL HIGH (ref 70–99)
Potassium: 3.3 mmol/L — ABNORMAL LOW (ref 3.5–5.1)
Sodium: 137 mmol/L (ref 135–145)
Total Bilirubin: 0.4 mg/dL (ref 0.3–1.2)
Total Protein: 7.9 g/dL (ref 6.5–8.1)

## 2022-07-03 LAB — DIFFERENTIAL
Abs Immature Granulocytes: 0.04 10*3/uL (ref 0.00–0.07)
Basophils Absolute: 0 10*3/uL (ref 0.0–0.1)
Basophils Relative: 1 %
Eosinophils Absolute: 0 10*3/uL (ref 0.0–0.5)
Eosinophils Relative: 0 %
Immature Granulocytes: 2 %
Lymphocytes Relative: 31 %
Lymphs Abs: 0.7 10*3/uL (ref 0.7–4.0)
Monocytes Absolute: 0.4 10*3/uL (ref 0.1–1.0)
Monocytes Relative: 19 %
Neutro Abs: 1.1 10*3/uL — ABNORMAL LOW (ref 1.7–7.7)
Neutrophils Relative %: 47 %

## 2022-07-03 LAB — LIPASE, BLOOD: Lipase: 21 U/L (ref 11–51)

## 2022-07-03 MED ORDER — IOHEXOL 300 MG/ML  SOLN
100.0000 mL | Freq: Once | INTRAMUSCULAR | Status: AC | PRN
  Administered 2022-07-03: 100 mL via INTRAVENOUS

## 2022-07-03 NOTE — ED Provider Notes (Signed)
[place] EMERGENCY DEPARTMENT AT [facility]  Provider Note  CSN: [id number] Arrival date & time: 07/02/22 2340  History Chief Complaint  Patient presents with   Abdominal Pain    Marc Nielsen is a 52 y.o. 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 He reports mild-moderate intermittent RUQ abdominal pain for the last 2 days. Comes and goes, improved with tramadol. No fever, N/V/D or constipation. He has had neutropenic colitis in March and also found to have PE in May, taken off anticoagulation by his oncologist. He reports pain is resolved at this time.    Home Medications Prior to Admission medications   Medication Sig Start Date End Date Taking? Authorizing Provider  acyclovir (ZOVIRAX) 400 MG tablet Take 1 tablet (400 mg total) by mouth 2 (two) times daily. 05/25/22     docusate sodium (COLACE) 100 MG capsule Take 100-200 mg by mouth daily.    [provider]  Ferrous Fumarate (FERROCITE PO) Take 1 tablet by mouth daily with breakfast.    [provider]  TYLENOL 500 MG tablet Take 500-1,000 mg by mouth every 6 (six) hours as needed for mild pain or headache.    [provider]  venetoclax (VENCLEXTA) 100 MG tablet Take 4 tablets (400 mg total) by mouth daily. 04/15/22     venetoclax (VENCLEXTA) 100 MG tablet Take 4 tablets (400 mg total) by mouth daily. Take on days 1-21 of chemo cycle as directed by oncologist. Dose reduce to 1 tablet (100 mg) when taking Posaconazole. 06/17/22     venetoclax (VENCLEXTA) 100 MG tablet Take 4 tablets (400 mg total) by mouth daily. Take on days 1-21 of chemo cycle as directed by oncologist. Dose reduce to 1 tablet (100 mg) when taking Posaconazole. 06/17/22     enoxaparin (LOVENOX) 80 MG/0.8ML injection Inject 0.8 mLs (80 mg total) into the skin every 12 (twelve) hours. 05/25/22 05/25/22       Allergies    Patient has no known  allergies.   Review of Systems   Review of Systems Please see HPI for pertinent positives and negatives  Physical Exam BP 122/74   Pulse 90   Temp 98.9 F (37.2 C) (Oral)   Resp 16   SpO2 99%   Physical Exam Vitals and nursing note reviewed.  Constitutional:      Appearance: Normal appearance.  HENT:     Head: Normocephalic and atraumatic.     Nose: Nose normal.     Mouth/Throat:     Mouth: Mucous membranes are moist.  Eyes:     Extraocular Movements: Extraocular movements intact.     Conjunctiva/sclera: Conjunctivae normal.  Cardiovascular:     Rate and Rhythm: Normal rate.  Pulmonary:     Effort: Pulmonary effort is normal.     Breath sounds: Normal breath sounds.  Abdominal:     General: Abdomen is flat.     Palpations: Abdomen is soft.     Tenderness: There is abdominal tenderness (mild) in the right upper quadrant. There is no guarding. Negative signs include Murphy's sign and McBurney's sign.  Musculoskeletal:        General: No swelling. Normal range of motion.     Cervical back: Neck supple.     Comments: PICC line in RUE, no signs of infection  Skin:    General: Skin is warm and dry.  Neurological:     General:  No focal deficit present.     Mental Status: He is alert.  Psychiatric:        Mood and Affect: Mood normal.     ED Results / Procedures / Treatments   EKG EKG Interpretation  Date/Time:  Saturday July 02 2022 23:58:58 EDT Ventricular Rate:  100 PR Interval:  134 QRS Duration: 84 QT Interval:  326 QTC Calculation: 420 R Axis:   45 Text Interpretation: Normal sinus rhythm Anterior infarct (cited on or before 24-May-2022) Abnormal ECG When compared with ECG of 24-May-2022 18:02, Nonspecific T wave abnormality now evident in Inferior leads Otherwise no significant change Confirmed by Susy Frizzle [phone]) on 07/03/2022 2:20:08 AM  Procedures Procedures  Medications Ordered in the ED Medications  iohexol (OMNIPAQUE) 300 MG/ML solution  100 mL (100 mLs Intravenous Contrast Given 07/03/22 0302)    Initial Impression and Plan  Patient here with mild-moderate intermittent RUQ pain in setting of treatment for AML. He has some tenderness there but overall a benign exam and reassuring vitals. Labs done in triage show CBC with low WBC similar to recent labs at [facility], diff is pending. CMP is unremarkable. Lipase is normal. UA with some blood, he denies any urinary symptoms or flank pain. Will send for CT abd/pel and reassess   ED Course   Clinical Course as of 07/03/22 0327  Sun Jul 03, 2022  6045 Differential shows adequate neutrophils.  [CS]  [id number] I personally viewed the images from radiology studies and agree with radiologist interpretation: CT is negative for acute process. Patient continues to feel well and is comfortable going home. Advised to follow up with Oncology. RTED for any other concerns.  [CS]    Clinical Course User Index [CS] Pollyann Savoy, MD     MDM Rules/Calculators/A&P Medical Decision Making Problems Addressed: RUQ abdominal pain: acute illness or injury  Amount and/or Complexity of Data Reviewed Labs: ordered. Decision-making details documented in ED Course. Radiology: ordered and independent interpretation performed. Decision-making details documented in ED Course. ECG/medicine tests: ordered and independent interpretation performed. Decision-making details documented in ED Course.  Risk Prescription drug management.     Final Clinical Impression(s) / ED Diagnoses Final diagnoses:  RUQ abdominal pain  Acute myeloid leukemia not having achieved remission [facility])    Rx / DC Orders ED Discharge Orders     None        Pollyann Savoy, MD 07/03/22 [phone]

## 2022-07-03 NOTE — ED Notes (Signed)
ED Provider at bedside. 

## 2022-07-04 ENCOUNTER — Other Ambulatory Visit: Payer: Self-pay

## 2022-07-05 ENCOUNTER — Other Ambulatory Visit (HOSPITAL_COMMUNITY): Payer: Self-pay

## 2022-07-05 DIAGNOSIS — Z01818 Encounter for other preprocedural examination: Secondary | ICD-10-CM | POA: Diagnosis not present

## 2022-07-05 DIAGNOSIS — C9200 Acute myeloblastic leukemia, not having achieved remission: Secondary | ICD-10-CM | POA: Diagnosis not present

## 2022-07-05 MED ORDER — LEVOFLOXACIN 500 MG PO TABS
500.0000 mg | ORAL_TABLET | Freq: Every day | ORAL | 0 refills | Status: DC
  Filled 2022-07-05: qty 90, 90d supply, fill #0

## 2022-07-05 MED ORDER — AMOXICILLIN 500 MG PO CAPS
2000.0000 mg | ORAL_CAPSULE | ORAL | 0 refills | Status: DC | PRN
  Filled 2022-07-05 (×2): qty 4, 1d supply, fill #0

## 2022-07-05 MED ORDER — POSACONAZOLE 100 MG PO TBEC
300.0000 mg | DELAYED_RELEASE_TABLET | Freq: Every day | ORAL | 0 refills | Status: DC
  Filled 2022-07-05 – 2022-07-07 (×2): qty 270, 90d supply, fill #0
  Filled 2022-07-07: qty 90, 30d supply, fill #0

## 2022-07-06 ENCOUNTER — Other Ambulatory Visit (HOSPITAL_COMMUNITY): Payer: Self-pay

## 2022-07-07 ENCOUNTER — Other Ambulatory Visit (HOSPITAL_COMMUNITY): Payer: Self-pay

## 2022-07-08 ENCOUNTER — Other Ambulatory Visit (HOSPITAL_COMMUNITY): Payer: Self-pay

## 2022-07-08 DIAGNOSIS — Z01818 Encounter for other preprocedural examination: Secondary | ICD-10-CM | POA: Diagnosis not present

## 2022-07-08 DIAGNOSIS — C9200 Acute myeloblastic leukemia, not having achieved remission: Secondary | ICD-10-CM | POA: Diagnosis not present

## 2022-07-11 ENCOUNTER — Other Ambulatory Visit (HOSPITAL_COMMUNITY): Payer: Self-pay

## 2022-07-12 ENCOUNTER — Other Ambulatory Visit (HOSPITAL_COMMUNITY): Payer: Self-pay

## 2022-07-12 DIAGNOSIS — C9200 Acute myeloblastic leukemia, not having achieved remission: Secondary | ICD-10-CM | POA: Diagnosis not present

## 2022-07-13 ENCOUNTER — Other Ambulatory Visit (HOSPITAL_COMMUNITY): Payer: Self-pay

## 2022-07-14 ENCOUNTER — Other Ambulatory Visit (HOSPITAL_COMMUNITY): Payer: Self-pay

## 2022-07-15 DIAGNOSIS — Z452 Encounter for adjustment and management of vascular access device: Secondary | ICD-10-CM | POA: Diagnosis not present

## 2022-07-15 DIAGNOSIS — C9200 Acute myeloblastic leukemia, not having achieved remission: Secondary | ICD-10-CM | POA: Diagnosis not present

## 2022-07-18 DIAGNOSIS — Z52001 Unspecified donor, stem cells: Secondary | ICD-10-CM | POA: Diagnosis not present

## 2022-07-18 DIAGNOSIS — Z005 Encounter for examination of potential donor of organ and tissue: Secondary | ICD-10-CM | POA: Diagnosis not present

## 2022-07-19 DIAGNOSIS — Z01818 Encounter for other preprocedural examination: Secondary | ICD-10-CM | POA: Diagnosis not present

## 2022-07-19 DIAGNOSIS — C9201 Acute myeloblastic leukemia, in remission: Secondary | ICD-10-CM | POA: Diagnosis not present

## 2022-07-19 DIAGNOSIS — C9200 Acute myeloblastic leukemia, not having achieved remission: Secondary | ICD-10-CM | POA: Diagnosis not present

## 2022-07-19 DIAGNOSIS — Z7682 Awaiting organ transplant status: Secondary | ICD-10-CM | POA: Diagnosis not present

## 2022-07-22 DIAGNOSIS — E876 Hypokalemia: Secondary | ICD-10-CM | POA: Diagnosis not present

## 2022-07-22 DIAGNOSIS — C9201 Acute myeloblastic leukemia, in remission: Secondary | ICD-10-CM | POA: Diagnosis not present

## 2022-07-22 DIAGNOSIS — C9200 Acute myeloblastic leukemia, not having achieved remission: Secondary | ICD-10-CM | POA: Diagnosis not present

## 2022-07-22 DIAGNOSIS — Z7682 Awaiting organ transplant status: Secondary | ICD-10-CM | POA: Diagnosis not present

## 2022-07-22 DIAGNOSIS — D61818 Other pancytopenia: Secondary | ICD-10-CM | POA: Diagnosis not present

## 2022-07-22 DIAGNOSIS — Z52001 Unspecified donor, stem cells: Secondary | ICD-10-CM | POA: Diagnosis not present

## 2022-07-25 DIAGNOSIS — Z7682 Awaiting organ transplant status: Secondary | ICD-10-CM | POA: Diagnosis not present

## 2022-07-25 DIAGNOSIS — Z01818 Encounter for other preprocedural examination: Secondary | ICD-10-CM | POA: Diagnosis not present

## 2022-07-25 DIAGNOSIS — C9200 Acute myeloblastic leukemia, not having achieved remission: Secondary | ICD-10-CM | POA: Diagnosis not present

## 2022-07-25 DIAGNOSIS — C9201 Acute myeloblastic leukemia, in remission: Secondary | ICD-10-CM | POA: Diagnosis not present

## 2022-07-27 DIAGNOSIS — N1831 Chronic kidney disease, stage 3a: Secondary | ICD-10-CM | POA: Diagnosis not present

## 2022-07-27 DIAGNOSIS — G893 Neoplasm related pain (acute) (chronic): Secondary | ICD-10-CM | POA: Diagnosis not present

## 2022-07-27 DIAGNOSIS — K649 Unspecified hemorrhoids: Secondary | ICD-10-CM | POA: Diagnosis not present

## 2022-07-27 DIAGNOSIS — C9201 Acute myeloblastic leukemia, in remission: Secondary | ICD-10-CM | POA: Diagnosis not present

## 2022-07-28 DIAGNOSIS — K649 Unspecified hemorrhoids: Secondary | ICD-10-CM | POA: Diagnosis not present

## 2022-07-28 DIAGNOSIS — E876 Hypokalemia: Secondary | ICD-10-CM | POA: Diagnosis not present

## 2022-07-28 DIAGNOSIS — K1231 Oral mucositis (ulcerative) due to antineoplastic therapy: Secondary | ICD-10-CM | POA: Diagnosis not present

## 2022-07-28 DIAGNOSIS — N281 Cyst of kidney, acquired: Secondary | ICD-10-CM | POA: Diagnosis not present

## 2022-07-28 DIAGNOSIS — R1319 Other dysphagia: Secondary | ICD-10-CM | POA: Diagnosis not present

## 2022-07-28 DIAGNOSIS — R638 Other symptoms and signs concerning food and fluid intake: Secondary | ICD-10-CM | POA: Diagnosis not present

## 2022-07-28 DIAGNOSIS — K521 Toxic gastroenteritis and colitis: Secondary | ICD-10-CM | POA: Diagnosis not present

## 2022-07-28 DIAGNOSIS — I081 Rheumatic disorders of both mitral and tricuspid valves: Secondary | ICD-10-CM | POA: Diagnosis not present

## 2022-07-28 DIAGNOSIS — B259 Cytomegaloviral disease, unspecified: Secondary | ICD-10-CM | POA: Diagnosis not present

## 2022-07-28 DIAGNOSIS — H1189 Other specified disorders of conjunctiva: Secondary | ICD-10-CM | POA: Diagnosis not present

## 2022-07-28 DIAGNOSIS — Z52001 Unspecified donor, stem cells: Secondary | ICD-10-CM | POA: Diagnosis not present

## 2022-07-28 DIAGNOSIS — R1310 Dysphagia, unspecified: Secondary | ICD-10-CM | POA: Diagnosis not present

## 2022-07-28 DIAGNOSIS — C9201 Acute myeloblastic leukemia, in remission: Secondary | ICD-10-CM | POA: Diagnosis not present

## 2022-07-28 DIAGNOSIS — K7689 Other specified diseases of liver: Secondary | ICD-10-CM | POA: Diagnosis not present

## 2022-07-28 DIAGNOSIS — Z86711 Personal history of pulmonary embolism: Secondary | ICD-10-CM | POA: Diagnosis not present

## 2022-07-28 DIAGNOSIS — K1230 Oral mucositis (ulcerative), unspecified: Secondary | ICD-10-CM | POA: Diagnosis not present

## 2022-07-28 DIAGNOSIS — R0602 Shortness of breath: Secondary | ICD-10-CM | POA: Diagnosis not present

## 2022-07-28 DIAGNOSIS — R000 Tachycardia, unspecified: Secondary | ICD-10-CM | POA: Diagnosis not present

## 2022-07-28 DIAGNOSIS — K567 Ileus, unspecified: Secondary | ICD-10-CM | POA: Diagnosis not present

## 2022-07-28 DIAGNOSIS — R1312 Dysphagia, oropharyngeal phase: Secondary | ICD-10-CM | POA: Diagnosis not present

## 2022-07-28 DIAGNOSIS — R0689 Other abnormalities of breathing: Secondary | ICD-10-CM | POA: Diagnosis not present

## 2022-07-28 DIAGNOSIS — I129 Hypertensive chronic kidney disease with stage 1 through stage 4 chronic kidney disease, or unspecified chronic kidney disease: Secondary | ICD-10-CM | POA: Diagnosis not present

## 2022-07-28 DIAGNOSIS — Z934 Other artificial openings of gastrointestinal tract status: Secondary | ICD-10-CM | POA: Diagnosis not present

## 2022-07-28 DIAGNOSIS — R451 Restlessness and agitation: Secondary | ICD-10-CM | POA: Diagnosis not present

## 2022-07-28 DIAGNOSIS — R918 Other nonspecific abnormal finding of lung field: Secondary | ICD-10-CM | POA: Diagnosis not present

## 2022-07-28 DIAGNOSIS — B009 Herpesviral infection, unspecified: Secondary | ICD-10-CM | POA: Diagnosis not present

## 2022-07-28 DIAGNOSIS — Z4659 Encounter for fitting and adjustment of other gastrointestinal appliance and device: Secondary | ICD-10-CM | POA: Diagnosis not present

## 2022-07-28 DIAGNOSIS — C9400 Acute erythroid leukemia, not having achieved remission: Secondary | ICD-10-CM | POA: Diagnosis not present

## 2022-07-28 DIAGNOSIS — M25512 Pain in left shoulder: Secondary | ICD-10-CM | POA: Diagnosis not present

## 2022-07-28 DIAGNOSIS — D61818 Other pancytopenia: Secondary | ICD-10-CM | POA: Diagnosis not present

## 2022-07-28 DIAGNOSIS — M25511 Pain in right shoulder: Secondary | ICD-10-CM | POA: Diagnosis not present

## 2022-07-28 DIAGNOSIS — N3289 Other specified disorders of bladder: Secondary | ICD-10-CM | POA: Diagnosis not present

## 2022-07-28 DIAGNOSIS — I472 Ventricular tachycardia, unspecified: Secondary | ICD-10-CM | POA: Diagnosis not present

## 2022-07-28 DIAGNOSIS — D709 Neutropenia, unspecified: Secondary | ICD-10-CM | POA: Diagnosis not present

## 2022-07-28 DIAGNOSIS — Z9484 Stem cells transplant status: Secondary | ICD-10-CM | POA: Diagnosis not present

## 2022-07-28 DIAGNOSIS — N1831 Chronic kidney disease, stage 3a: Secondary | ICD-10-CM | POA: Diagnosis not present

## 2022-07-28 DIAGNOSIS — R530 Neoplastic (malignant) related fatigue: Secondary | ICD-10-CM | POA: Diagnosis not present

## 2022-07-28 DIAGNOSIS — D630 Anemia in neoplastic disease: Secondary | ICD-10-CM | POA: Diagnosis not present

## 2022-07-28 DIAGNOSIS — R509 Fever, unspecified: Secondary | ICD-10-CM | POA: Diagnosis not present

## 2022-07-28 DIAGNOSIS — Z9481 Bone marrow transplant status: Secondary | ICD-10-CM | POA: Diagnosis not present

## 2022-07-28 DIAGNOSIS — N3091 Cystitis, unspecified with hematuria: Secondary | ICD-10-CM | POA: Diagnosis not present

## 2022-07-28 DIAGNOSIS — G893 Neoplasm related pain (acute) (chronic): Secondary | ICD-10-CM | POA: Diagnosis not present

## 2022-07-28 DIAGNOSIS — T451X5A Adverse effect of antineoplastic and immunosuppressive drugs, initial encounter: Secondary | ICD-10-CM | POA: Diagnosis not present

## 2022-07-28 DIAGNOSIS — G9341 Metabolic encephalopathy: Secondary | ICD-10-CM | POA: Diagnosis not present

## 2022-07-28 DIAGNOSIS — R5081 Fever presenting with conditions classified elsewhere: Secondary | ICD-10-CM | POA: Diagnosis not present

## 2022-07-28 DIAGNOSIS — R8279 Other abnormal findings on microbiological examination of urine: Secondary | ICD-10-CM | POA: Diagnosis not present

## 2022-07-28 DIAGNOSIS — R633 Feeding difficulties, unspecified: Secondary | ICD-10-CM | POA: Diagnosis not present

## 2022-07-28 DIAGNOSIS — R140 Abdominal distension (gaseous): Secondary | ICD-10-CM | POA: Diagnosis not present

## 2022-07-28 DIAGNOSIS — N3081 Other cystitis with hematuria: Secondary | ICD-10-CM | POA: Diagnosis not present

## 2022-07-28 DIAGNOSIS — I959 Hypotension, unspecified: Secondary | ICD-10-CM | POA: Diagnosis not present

## 2022-07-28 DIAGNOSIS — R7881 Bacteremia: Secondary | ICD-10-CM | POA: Diagnosis not present

## 2022-07-28 DIAGNOSIS — K219 Gastro-esophageal reflux disease without esophagitis: Secondary | ICD-10-CM | POA: Diagnosis not present

## 2022-07-28 DIAGNOSIS — R197 Diarrhea, unspecified: Secondary | ICD-10-CM | POA: Diagnosis not present

## 2022-07-28 DIAGNOSIS — B1001 Human herpesvirus 6 encephalitis: Secondary | ICD-10-CM | POA: Diagnosis not present

## 2022-07-28 DIAGNOSIS — D696 Thrombocytopenia, unspecified: Secondary | ICD-10-CM | POA: Diagnosis not present

## 2022-07-28 DIAGNOSIS — B957 Other staphylococcus as the cause of diseases classified elsewhere: Secondary | ICD-10-CM | POA: Diagnosis not present

## 2022-07-28 DIAGNOSIS — D61810 Antineoplastic chemotherapy induced pancytopenia: Secondary | ICD-10-CM | POA: Diagnosis not present

## 2022-07-28 DIAGNOSIS — Z83711 Family history of hyperplastic colon polyps: Secondary | ICD-10-CM | POA: Diagnosis not present

## 2022-07-28 DIAGNOSIS — J90 Pleural effusion, not elsewhere classified: Secondary | ICD-10-CM | POA: Diagnosis not present

## 2022-07-28 DIAGNOSIS — B338 Other specified viral diseases: Secondary | ICD-10-CM | POA: Diagnosis not present

## 2022-07-28 DIAGNOSIS — G4700 Insomnia, unspecified: Secondary | ICD-10-CM | POA: Diagnosis not present

## 2022-07-28 DIAGNOSIS — C9200 Acute myeloblastic leukemia, not having achieved remission: Secondary | ICD-10-CM | POA: Diagnosis not present

## 2022-07-29 DIAGNOSIS — N1831 Chronic kidney disease, stage 3a: Secondary | ICD-10-CM | POA: Diagnosis not present

## 2022-07-29 DIAGNOSIS — Z86711 Personal history of pulmonary embolism: Secondary | ICD-10-CM | POA: Diagnosis not present

## 2022-07-29 DIAGNOSIS — D709 Neutropenia, unspecified: Secondary | ICD-10-CM | POA: Diagnosis not present

## 2022-07-29 DIAGNOSIS — R638 Other symptoms and signs concerning food and fluid intake: Secondary | ICD-10-CM | POA: Diagnosis not present

## 2022-07-29 DIAGNOSIS — I959 Hypotension, unspecified: Secondary | ICD-10-CM | POA: Diagnosis not present

## 2022-07-29 DIAGNOSIS — M25511 Pain in right shoulder: Secondary | ICD-10-CM | POA: Diagnosis not present

## 2022-07-29 DIAGNOSIS — K649 Unspecified hemorrhoids: Secondary | ICD-10-CM | POA: Diagnosis not present

## 2022-07-29 DIAGNOSIS — G893 Neoplasm related pain (acute) (chronic): Secondary | ICD-10-CM | POA: Diagnosis not present

## 2022-07-29 DIAGNOSIS — M25512 Pain in left shoulder: Secondary | ICD-10-CM | POA: Diagnosis not present

## 2022-07-29 DIAGNOSIS — C9201 Acute myeloblastic leukemia, in remission: Secondary | ICD-10-CM | POA: Diagnosis not present

## 2022-07-30 DIAGNOSIS — K649 Unspecified hemorrhoids: Secondary | ICD-10-CM | POA: Diagnosis not present

## 2022-07-30 DIAGNOSIS — C9201 Acute myeloblastic leukemia, in remission: Secondary | ICD-10-CM | POA: Diagnosis not present

## 2022-07-30 DIAGNOSIS — D709 Neutropenia, unspecified: Secondary | ICD-10-CM | POA: Diagnosis not present

## 2022-07-30 DIAGNOSIS — Z86711 Personal history of pulmonary embolism: Secondary | ICD-10-CM | POA: Diagnosis not present

## 2022-07-30 DIAGNOSIS — G4700 Insomnia, unspecified: Secondary | ICD-10-CM | POA: Diagnosis not present

## 2022-07-30 DIAGNOSIS — G893 Neoplasm related pain (acute) (chronic): Secondary | ICD-10-CM | POA: Diagnosis not present

## 2022-07-30 DIAGNOSIS — M25511 Pain in right shoulder: Secondary | ICD-10-CM | POA: Diagnosis not present

## 2022-07-30 DIAGNOSIS — I959 Hypotension, unspecified: Secondary | ICD-10-CM | POA: Diagnosis not present

## 2022-07-30 DIAGNOSIS — N1831 Chronic kidney disease, stage 3a: Secondary | ICD-10-CM | POA: Diagnosis not present

## 2022-07-30 DIAGNOSIS — M25512 Pain in left shoulder: Secondary | ICD-10-CM | POA: Diagnosis not present

## 2022-07-31 DIAGNOSIS — G4700 Insomnia, unspecified: Secondary | ICD-10-CM | POA: Diagnosis not present

## 2022-07-31 DIAGNOSIS — N1831 Chronic kidney disease, stage 3a: Secondary | ICD-10-CM | POA: Diagnosis not present

## 2022-07-31 DIAGNOSIS — I959 Hypotension, unspecified: Secondary | ICD-10-CM | POA: Diagnosis not present

## 2022-07-31 DIAGNOSIS — M25511 Pain in right shoulder: Secondary | ICD-10-CM | POA: Diagnosis not present

## 2022-07-31 DIAGNOSIS — Z86711 Personal history of pulmonary embolism: Secondary | ICD-10-CM | POA: Diagnosis not present

## 2022-07-31 DIAGNOSIS — K649 Unspecified hemorrhoids: Secondary | ICD-10-CM | POA: Diagnosis not present

## 2022-07-31 DIAGNOSIS — C9201 Acute myeloblastic leukemia, in remission: Secondary | ICD-10-CM | POA: Diagnosis not present

## 2022-07-31 DIAGNOSIS — G893 Neoplasm related pain (acute) (chronic): Secondary | ICD-10-CM | POA: Diagnosis not present

## 2022-07-31 DIAGNOSIS — D709 Neutropenia, unspecified: Secondary | ICD-10-CM | POA: Diagnosis not present

## 2022-07-31 DIAGNOSIS — M25512 Pain in left shoulder: Secondary | ICD-10-CM | POA: Diagnosis not present

## 2022-08-01 DIAGNOSIS — G893 Neoplasm related pain (acute) (chronic): Secondary | ICD-10-CM | POA: Diagnosis not present

## 2022-08-01 DIAGNOSIS — G4700 Insomnia, unspecified: Secondary | ICD-10-CM | POA: Diagnosis not present

## 2022-08-01 DIAGNOSIS — K649 Unspecified hemorrhoids: Secondary | ICD-10-CM | POA: Diagnosis not present

## 2022-08-01 DIAGNOSIS — N1831 Chronic kidney disease, stage 3a: Secondary | ICD-10-CM | POA: Diagnosis not present

## 2022-08-01 DIAGNOSIS — Z86711 Personal history of pulmonary embolism: Secondary | ICD-10-CM | POA: Diagnosis not present

## 2022-08-01 DIAGNOSIS — M25512 Pain in left shoulder: Secondary | ICD-10-CM | POA: Diagnosis not present

## 2022-08-01 DIAGNOSIS — C9201 Acute myeloblastic leukemia, in remission: Secondary | ICD-10-CM | POA: Diagnosis not present

## 2022-08-01 DIAGNOSIS — M25511 Pain in right shoulder: Secondary | ICD-10-CM | POA: Diagnosis not present

## 2022-08-01 DIAGNOSIS — D709 Neutropenia, unspecified: Secondary | ICD-10-CM | POA: Diagnosis not present

## 2022-08-01 DIAGNOSIS — I959 Hypotension, unspecified: Secondary | ICD-10-CM | POA: Diagnosis not present

## 2022-08-02 DIAGNOSIS — K649 Unspecified hemorrhoids: Secondary | ICD-10-CM | POA: Diagnosis not present

## 2022-08-02 DIAGNOSIS — G4700 Insomnia, unspecified: Secondary | ICD-10-CM | POA: Diagnosis not present

## 2022-08-02 DIAGNOSIS — G893 Neoplasm related pain (acute) (chronic): Secondary | ICD-10-CM | POA: Diagnosis not present

## 2022-08-02 DIAGNOSIS — M25512 Pain in left shoulder: Secondary | ICD-10-CM | POA: Diagnosis not present

## 2022-08-02 DIAGNOSIS — Z52001 Unspecified donor, stem cells: Secondary | ICD-10-CM | POA: Diagnosis not present

## 2022-08-02 DIAGNOSIS — C9201 Acute myeloblastic leukemia, in remission: Secondary | ICD-10-CM | POA: Diagnosis not present

## 2022-08-02 DIAGNOSIS — D709 Neutropenia, unspecified: Secondary | ICD-10-CM | POA: Diagnosis not present

## 2022-08-02 DIAGNOSIS — N1831 Chronic kidney disease, stage 3a: Secondary | ICD-10-CM | POA: Diagnosis not present

## 2022-08-02 DIAGNOSIS — I959 Hypotension, unspecified: Secondary | ICD-10-CM | POA: Diagnosis not present

## 2022-08-02 DIAGNOSIS — Z86711 Personal history of pulmonary embolism: Secondary | ICD-10-CM | POA: Diagnosis not present

## 2022-08-02 DIAGNOSIS — M25511 Pain in right shoulder: Secondary | ICD-10-CM | POA: Diagnosis not present

## 2022-08-03 DIAGNOSIS — R530 Neoplastic (malignant) related fatigue: Secondary | ICD-10-CM | POA: Diagnosis not present

## 2022-08-03 DIAGNOSIS — M25511 Pain in right shoulder: Secondary | ICD-10-CM | POA: Diagnosis not present

## 2022-08-03 DIAGNOSIS — K649 Unspecified hemorrhoids: Secondary | ICD-10-CM | POA: Diagnosis not present

## 2022-08-03 DIAGNOSIS — R918 Other nonspecific abnormal finding of lung field: Secondary | ICD-10-CM | POA: Diagnosis not present

## 2022-08-03 DIAGNOSIS — G4700 Insomnia, unspecified: Secondary | ICD-10-CM | POA: Diagnosis not present

## 2022-08-03 DIAGNOSIS — I959 Hypotension, unspecified: Secondary | ICD-10-CM | POA: Diagnosis not present

## 2022-08-03 DIAGNOSIS — C9201 Acute myeloblastic leukemia, in remission: Secondary | ICD-10-CM | POA: Diagnosis not present

## 2022-08-03 DIAGNOSIS — N1831 Chronic kidney disease, stage 3a: Secondary | ICD-10-CM | POA: Diagnosis not present

## 2022-08-03 DIAGNOSIS — Z86711 Personal history of pulmonary embolism: Secondary | ICD-10-CM | POA: Diagnosis not present

## 2022-08-03 DIAGNOSIS — G893 Neoplasm related pain (acute) (chronic): Secondary | ICD-10-CM | POA: Diagnosis not present

## 2022-08-03 DIAGNOSIS — M25512 Pain in left shoulder: Secondary | ICD-10-CM | POA: Diagnosis not present

## 2022-08-04 DIAGNOSIS — Z86711 Personal history of pulmonary embolism: Secondary | ICD-10-CM | POA: Diagnosis not present

## 2022-08-04 DIAGNOSIS — G4700 Insomnia, unspecified: Secondary | ICD-10-CM | POA: Diagnosis not present

## 2022-08-04 DIAGNOSIS — M25512 Pain in left shoulder: Secondary | ICD-10-CM | POA: Diagnosis not present

## 2022-08-04 DIAGNOSIS — M25511 Pain in right shoulder: Secondary | ICD-10-CM | POA: Diagnosis not present

## 2022-08-04 DIAGNOSIS — K649 Unspecified hemorrhoids: Secondary | ICD-10-CM | POA: Diagnosis not present

## 2022-08-04 DIAGNOSIS — N1831 Chronic kidney disease, stage 3a: Secondary | ICD-10-CM | POA: Diagnosis not present

## 2022-08-04 DIAGNOSIS — C9201 Acute myeloblastic leukemia, in remission: Secondary | ICD-10-CM | POA: Diagnosis not present

## 2022-08-04 DIAGNOSIS — I959 Hypotension, unspecified: Secondary | ICD-10-CM | POA: Diagnosis not present

## 2022-08-04 DIAGNOSIS — D709 Neutropenia, unspecified: Secondary | ICD-10-CM | POA: Diagnosis not present

## 2022-08-04 DIAGNOSIS — G893 Neoplasm related pain (acute) (chronic): Secondary | ICD-10-CM | POA: Diagnosis not present

## 2022-08-04 IMAGING — MR MR SHOULDER*R* W/O CM
4 of 5 series · 21 of 40 positions shown · non-contrast
Comparison: None.

CLINICAL DATA: Generalized right shoulder pain. Patient reports
pain increases with abduction of arm.

EXAM:
MRI OF THE RIGHT SHOULDER WITHOUT CONTRAST
TECHNIQUE: Multiplanar, multisequence MR imaging of the shoulder was performed.
No intravenous contrast was administered.

[Series 6: T2 fat-sat · axial · right · 3.5mm · 0.47mm/px · z∈[-16,+99]mm · 8 of 27 slices shown (1 of 3)]
[im 1/27]
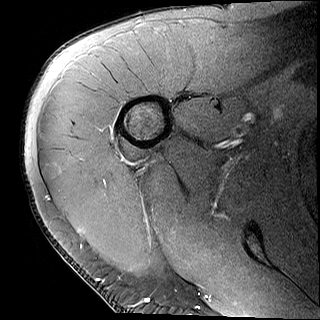
[im 3/27]
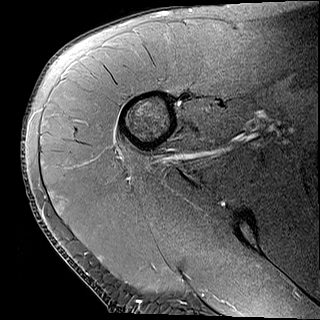
[im 9/27]
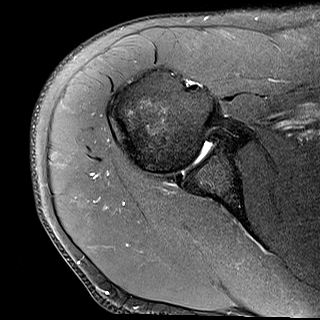
[im 12/27]
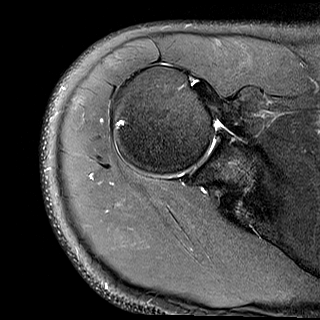
[im 15/27]
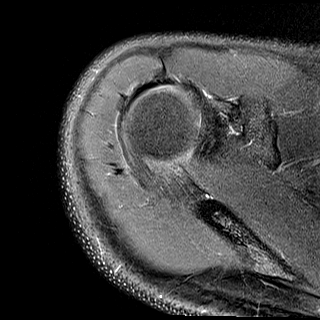
[im 18/27]
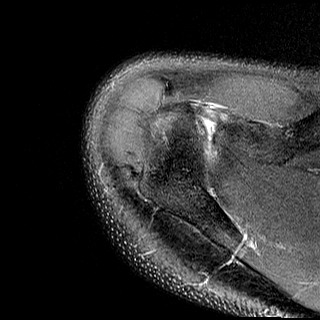
[im 24/27]
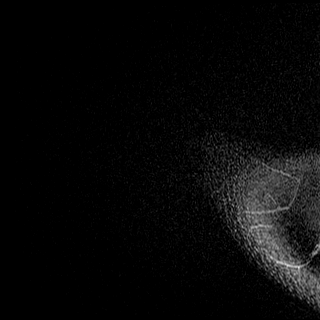
[im 27/27]
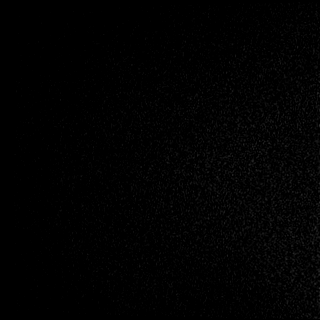

[Series 7: T2 fat-sat · oblique · right · 4.0mm · 0.22mm/px · 3 of 23 slices shown (2 of 3)]
[im 4/23]
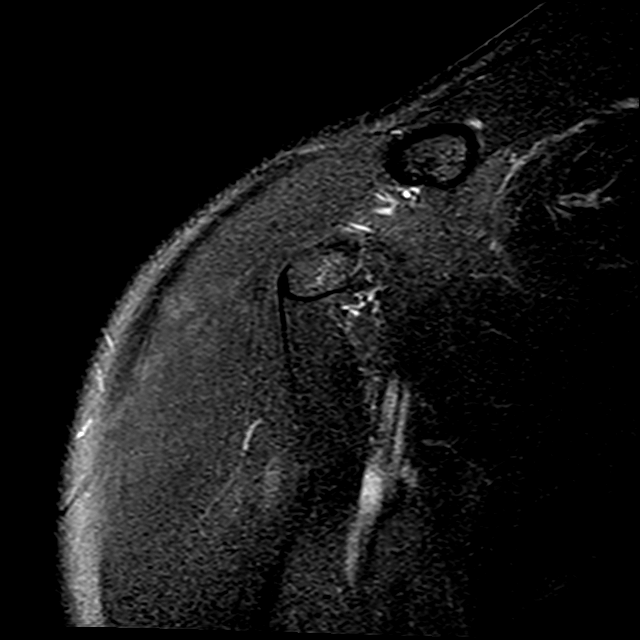
[im 12/23]
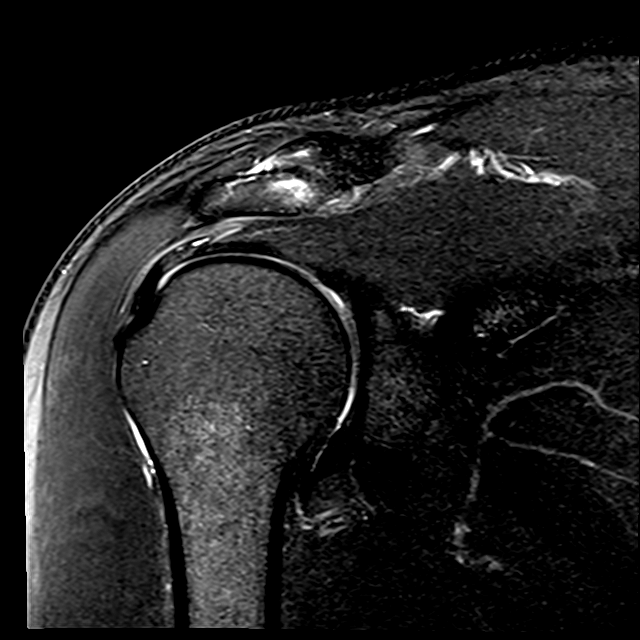
[im 19/23]
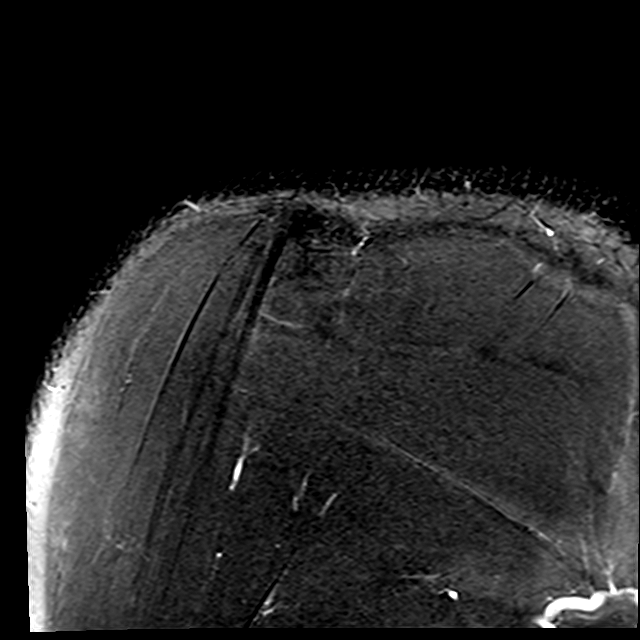

[Series 8: PD · oblique · right · 4.0mm · 0.22mm/px · 7 of 23 slices shown]
[im 1/23]
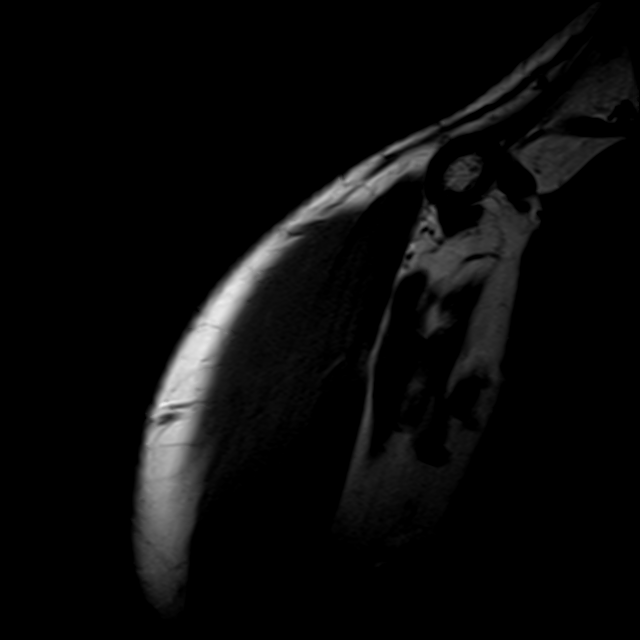
[im 4/23]
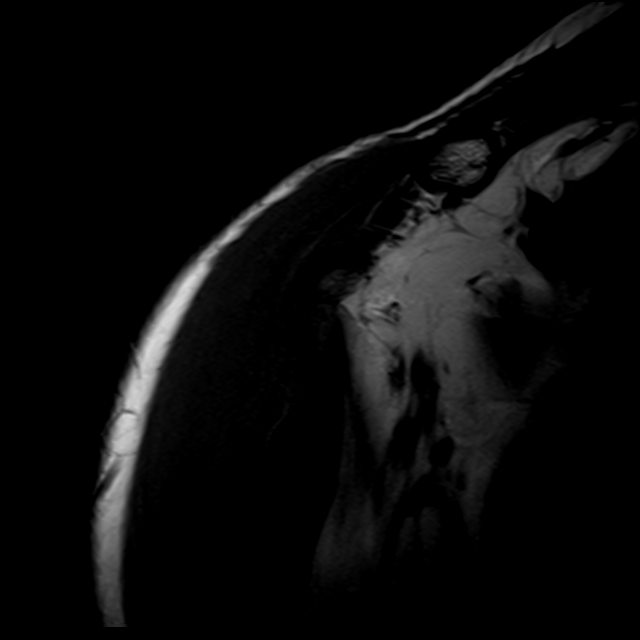
[im 8/23]
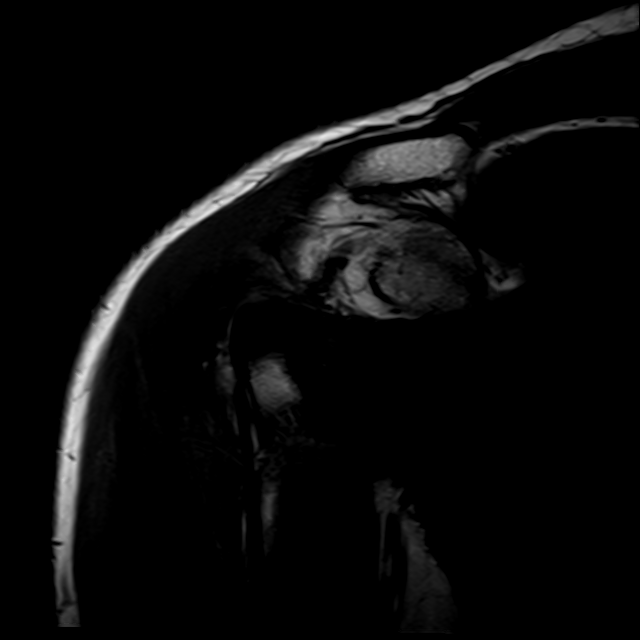
[im 12/23]
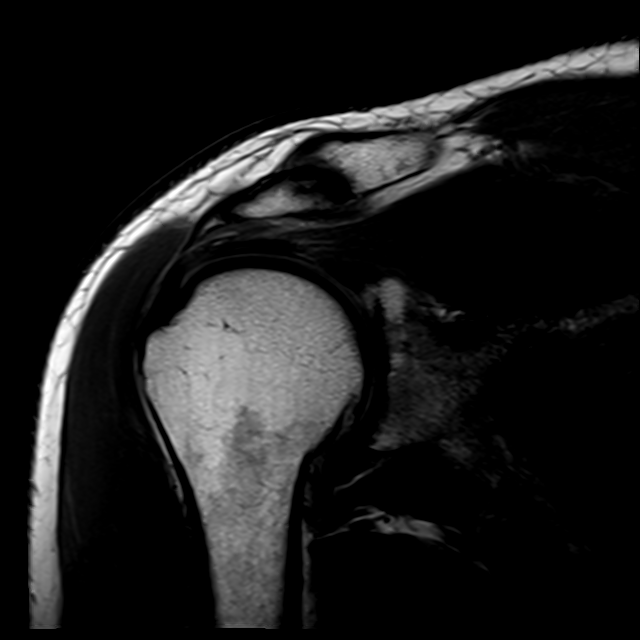
[im 15/23]
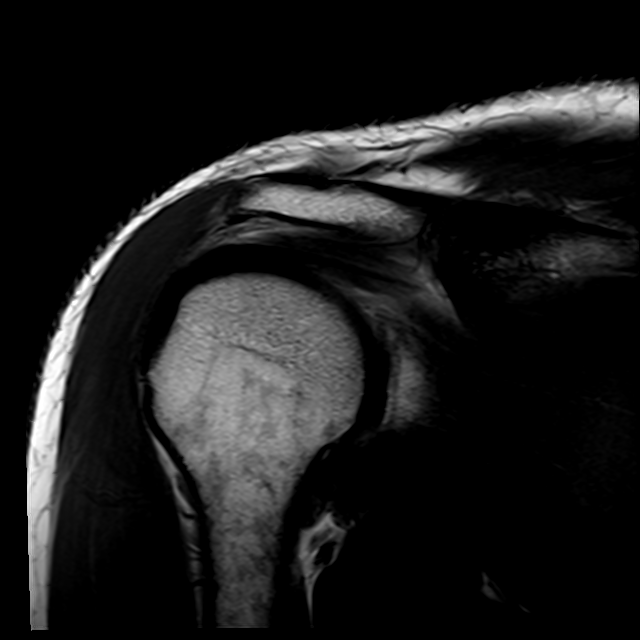
[im 19/23]
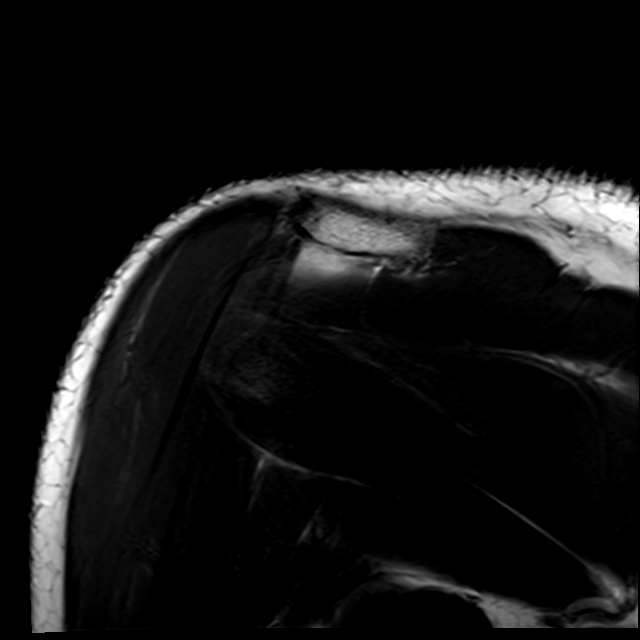
[im 23/23]
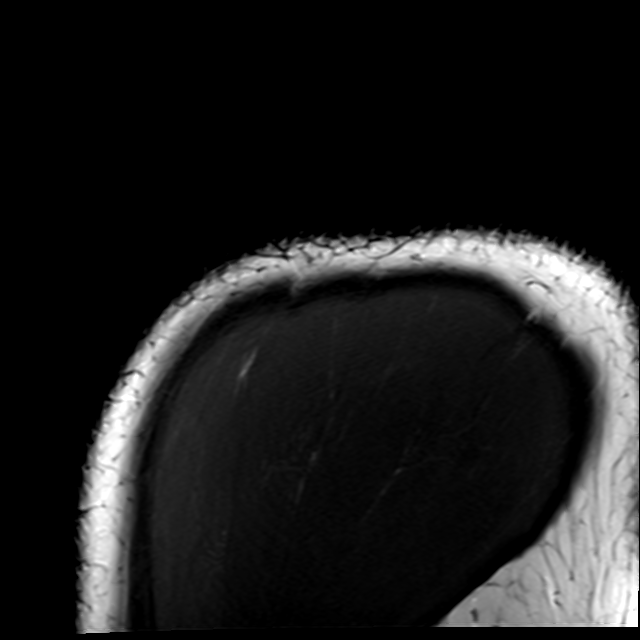

[Series 9: T2 fat-sat · oblique · right · 4.5mm · 0.50mm/px · 3 of 25 slices shown (3 of 3)]
[im 4/25]
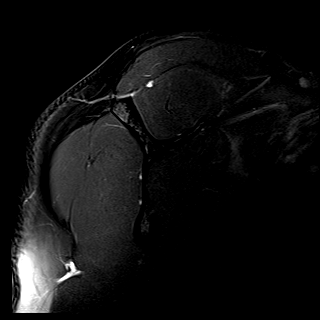
[im 14/25]
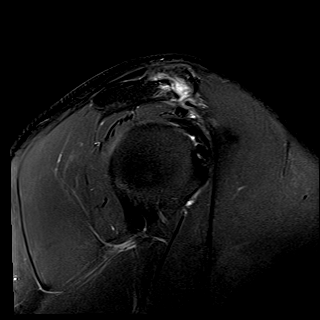
[im 21/25]
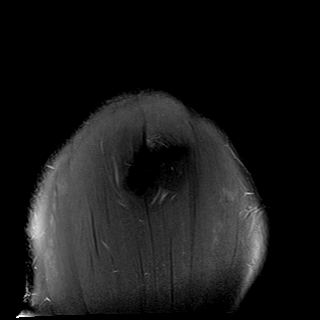

[21 of 40 positions shown; findings below may reference images not displayed]

FINDINGS: Rotator cuff: Low-grade interstitial tear of the supraspinatus
tendon at the myotendinous junction. No intramuscular hematoma.
Distal tendon is intact. Infraspinatus, subscapularis, and teres
minor tendons within normal limits. No full-thickness or retracted
rotator cuff tear.

Muscles: Preserved bulk and signal intensity of the rotator cuff
musculature without edema, atrophy, or fatty infiltration.

Biceps long head:  Intact and appropriately positioned.

Acromioclavicular Joint: Mild degenerative changes of the AC joint.
No significant subacromial-subdeltoid bursal fluid.

Glenohumeral Joint: No joint effusion. No cartilage defect.

Labrum: Grossly intact although evaluation is limited in the absence
of intra-articular fluid. No paralabral cyst.

Bones: No acute fracture. No dislocation. No bone marrow edema. No
marrow replacing bone lesion.

Other: None.
IMPRESSION: Right shoulder:

1. Low-grade interstitial tear of the supraspinatus tendon at the
myotendinous junction. No full-thickness or retracted rotator cuff
tear.
2. Mild AC joint osteoarthritis.

## 2022-08-05 DIAGNOSIS — N1831 Chronic kidney disease, stage 3a: Secondary | ICD-10-CM | POA: Diagnosis not present

## 2022-08-05 DIAGNOSIS — G893 Neoplasm related pain (acute) (chronic): Secondary | ICD-10-CM | POA: Diagnosis not present

## 2022-08-05 DIAGNOSIS — D709 Neutropenia, unspecified: Secondary | ICD-10-CM | POA: Diagnosis not present

## 2022-08-05 DIAGNOSIS — G4700 Insomnia, unspecified: Secondary | ICD-10-CM | POA: Diagnosis not present

## 2022-08-05 DIAGNOSIS — K649 Unspecified hemorrhoids: Secondary | ICD-10-CM | POA: Diagnosis not present

## 2022-08-05 DIAGNOSIS — M25512 Pain in left shoulder: Secondary | ICD-10-CM | POA: Diagnosis not present

## 2022-08-05 DIAGNOSIS — C9201 Acute myeloblastic leukemia, in remission: Secondary | ICD-10-CM | POA: Diagnosis not present

## 2022-08-05 DIAGNOSIS — M25511 Pain in right shoulder: Secondary | ICD-10-CM | POA: Diagnosis not present

## 2022-08-05 DIAGNOSIS — I959 Hypotension, unspecified: Secondary | ICD-10-CM | POA: Diagnosis not present

## 2022-08-05 DIAGNOSIS — Z86711 Personal history of pulmonary embolism: Secondary | ICD-10-CM | POA: Diagnosis not present

## 2022-08-06 DIAGNOSIS — M25511 Pain in right shoulder: Secondary | ICD-10-CM | POA: Diagnosis not present

## 2022-08-06 DIAGNOSIS — G4700 Insomnia, unspecified: Secondary | ICD-10-CM | POA: Diagnosis not present

## 2022-08-06 DIAGNOSIS — M25512 Pain in left shoulder: Secondary | ICD-10-CM | POA: Diagnosis not present

## 2022-08-06 DIAGNOSIS — D709 Neutropenia, unspecified: Secondary | ICD-10-CM | POA: Diagnosis not present

## 2022-08-06 DIAGNOSIS — N1831 Chronic kidney disease, stage 3a: Secondary | ICD-10-CM | POA: Diagnosis not present

## 2022-08-06 DIAGNOSIS — Z86711 Personal history of pulmonary embolism: Secondary | ICD-10-CM | POA: Diagnosis not present

## 2022-08-06 DIAGNOSIS — C9201 Acute myeloblastic leukemia, in remission: Secondary | ICD-10-CM | POA: Diagnosis not present

## 2022-08-06 DIAGNOSIS — G893 Neoplasm related pain (acute) (chronic): Secondary | ICD-10-CM | POA: Diagnosis not present

## 2022-08-06 DIAGNOSIS — I959 Hypotension, unspecified: Secondary | ICD-10-CM | POA: Diagnosis not present

## 2022-08-06 DIAGNOSIS — K649 Unspecified hemorrhoids: Secondary | ICD-10-CM | POA: Diagnosis not present

## 2022-08-07 DIAGNOSIS — M25512 Pain in left shoulder: Secondary | ICD-10-CM | POA: Diagnosis not present

## 2022-08-07 DIAGNOSIS — G893 Neoplasm related pain (acute) (chronic): Secondary | ICD-10-CM | POA: Diagnosis not present

## 2022-08-07 DIAGNOSIS — M25511 Pain in right shoulder: Secondary | ICD-10-CM | POA: Diagnosis not present

## 2022-08-07 DIAGNOSIS — K649 Unspecified hemorrhoids: Secondary | ICD-10-CM | POA: Diagnosis not present

## 2022-08-07 DIAGNOSIS — Z86711 Personal history of pulmonary embolism: Secondary | ICD-10-CM | POA: Diagnosis not present

## 2022-08-07 DIAGNOSIS — D709 Neutropenia, unspecified: Secondary | ICD-10-CM | POA: Diagnosis not present

## 2022-08-07 DIAGNOSIS — N1831 Chronic kidney disease, stage 3a: Secondary | ICD-10-CM | POA: Diagnosis not present

## 2022-08-07 DIAGNOSIS — I959 Hypotension, unspecified: Secondary | ICD-10-CM | POA: Diagnosis not present

## 2022-08-07 DIAGNOSIS — C9201 Acute myeloblastic leukemia, in remission: Secondary | ICD-10-CM | POA: Diagnosis not present

## 2022-08-07 DIAGNOSIS — G4700 Insomnia, unspecified: Secondary | ICD-10-CM | POA: Diagnosis not present

## 2022-08-08 DIAGNOSIS — D709 Neutropenia, unspecified: Secondary | ICD-10-CM | POA: Diagnosis not present

## 2022-08-08 DIAGNOSIS — G4700 Insomnia, unspecified: Secondary | ICD-10-CM | POA: Diagnosis not present

## 2022-08-08 DIAGNOSIS — R5081 Fever presenting with conditions classified elsewhere: Secondary | ICD-10-CM | POA: Diagnosis not present

## 2022-08-08 DIAGNOSIS — K1230 Oral mucositis (ulcerative), unspecified: Secondary | ICD-10-CM | POA: Diagnosis not present

## 2022-08-08 DIAGNOSIS — K521 Toxic gastroenteritis and colitis: Secondary | ICD-10-CM | POA: Diagnosis not present

## 2022-08-08 DIAGNOSIS — C9200 Acute myeloblastic leukemia, not having achieved remission: Secondary | ICD-10-CM | POA: Diagnosis not present

## 2022-08-08 DIAGNOSIS — Z9484 Stem cells transplant status: Secondary | ICD-10-CM | POA: Diagnosis not present

## 2022-08-08 DIAGNOSIS — K649 Unspecified hemorrhoids: Secondary | ICD-10-CM | POA: Diagnosis not present

## 2022-08-08 DIAGNOSIS — N1831 Chronic kidney disease, stage 3a: Secondary | ICD-10-CM | POA: Diagnosis not present

## 2022-08-08 DIAGNOSIS — D61810 Antineoplastic chemotherapy induced pancytopenia: Secondary | ICD-10-CM | POA: Diagnosis not present

## 2022-08-09 DIAGNOSIS — K521 Toxic gastroenteritis and colitis: Secondary | ICD-10-CM | POA: Diagnosis not present

## 2022-08-09 DIAGNOSIS — C9200 Acute myeloblastic leukemia, not having achieved remission: Secondary | ICD-10-CM | POA: Diagnosis not present

## 2022-08-09 DIAGNOSIS — D709 Neutropenia, unspecified: Secondary | ICD-10-CM | POA: Diagnosis not present

## 2022-08-09 DIAGNOSIS — N1831 Chronic kidney disease, stage 3a: Secondary | ICD-10-CM | POA: Diagnosis not present

## 2022-08-09 DIAGNOSIS — Z9484 Stem cells transplant status: Secondary | ICD-10-CM | POA: Diagnosis not present

## 2022-08-09 DIAGNOSIS — R5081 Fever presenting with conditions classified elsewhere: Secondary | ICD-10-CM | POA: Diagnosis not present

## 2022-08-09 DIAGNOSIS — G4700 Insomnia, unspecified: Secondary | ICD-10-CM | POA: Diagnosis not present

## 2022-08-09 DIAGNOSIS — K1230 Oral mucositis (ulcerative), unspecified: Secondary | ICD-10-CM | POA: Diagnosis not present

## 2022-08-09 DIAGNOSIS — K649 Unspecified hemorrhoids: Secondary | ICD-10-CM | POA: Diagnosis not present

## 2022-08-09 DIAGNOSIS — D61810 Antineoplastic chemotherapy induced pancytopenia: Secondary | ICD-10-CM | POA: Diagnosis not present

## 2022-08-10 DIAGNOSIS — B009 Herpesviral infection, unspecified: Secondary | ICD-10-CM | POA: Diagnosis not present

## 2022-08-10 DIAGNOSIS — R5081 Fever presenting with conditions classified elsewhere: Secondary | ICD-10-CM | POA: Diagnosis not present

## 2022-08-10 DIAGNOSIS — D709 Neutropenia, unspecified: Secondary | ICD-10-CM | POA: Diagnosis not present

## 2022-08-10 DIAGNOSIS — Z4659 Encounter for fitting and adjustment of other gastrointestinal appliance and device: Secondary | ICD-10-CM | POA: Diagnosis not present

## 2022-08-10 DIAGNOSIS — K1230 Oral mucositis (ulcerative), unspecified: Secondary | ICD-10-CM | POA: Diagnosis not present

## 2022-08-10 DIAGNOSIS — K521 Toxic gastroenteritis and colitis: Secondary | ICD-10-CM | POA: Diagnosis not present

## 2022-08-10 DIAGNOSIS — C9200 Acute myeloblastic leukemia, not having achieved remission: Secondary | ICD-10-CM | POA: Diagnosis not present

## 2022-08-11 DIAGNOSIS — Z9484 Stem cells transplant status: Secondary | ICD-10-CM | POA: Diagnosis not present

## 2022-08-11 DIAGNOSIS — D61810 Antineoplastic chemotherapy induced pancytopenia: Secondary | ICD-10-CM | POA: Diagnosis not present

## 2022-08-11 DIAGNOSIS — R5081 Fever presenting with conditions classified elsewhere: Secondary | ICD-10-CM | POA: Diagnosis not present

## 2022-08-11 DIAGNOSIS — K521 Toxic gastroenteritis and colitis: Secondary | ICD-10-CM | POA: Diagnosis not present

## 2022-08-11 DIAGNOSIS — B009 Herpesviral infection, unspecified: Secondary | ICD-10-CM | POA: Diagnosis not present

## 2022-08-11 DIAGNOSIS — C9200 Acute myeloblastic leukemia, not having achieved remission: Secondary | ICD-10-CM | POA: Diagnosis not present

## 2022-08-11 DIAGNOSIS — D709 Neutropenia, unspecified: Secondary | ICD-10-CM | POA: Diagnosis not present

## 2022-08-11 DIAGNOSIS — K1230 Oral mucositis (ulcerative), unspecified: Secondary | ICD-10-CM | POA: Diagnosis not present

## 2022-08-12 DIAGNOSIS — C9200 Acute myeloblastic leukemia, not having achieved remission: Secondary | ICD-10-CM | POA: Diagnosis not present

## 2022-08-12 DIAGNOSIS — D61810 Antineoplastic chemotherapy induced pancytopenia: Secondary | ICD-10-CM | POA: Diagnosis not present

## 2022-08-12 DIAGNOSIS — R5081 Fever presenting with conditions classified elsewhere: Secondary | ICD-10-CM | POA: Diagnosis not present

## 2022-08-12 DIAGNOSIS — K1230 Oral mucositis (ulcerative), unspecified: Secondary | ICD-10-CM | POA: Diagnosis not present

## 2022-08-12 DIAGNOSIS — D709 Neutropenia, unspecified: Secondary | ICD-10-CM | POA: Diagnosis not present

## 2022-08-12 DIAGNOSIS — B009 Herpesviral infection, unspecified: Secondary | ICD-10-CM | POA: Diagnosis not present

## 2022-08-12 DIAGNOSIS — Z9484 Stem cells transplant status: Secondary | ICD-10-CM | POA: Diagnosis not present

## 2022-08-12 DIAGNOSIS — K521 Toxic gastroenteritis and colitis: Secondary | ICD-10-CM | POA: Diagnosis not present

## 2022-08-13 DIAGNOSIS — B009 Herpesviral infection, unspecified: Secondary | ICD-10-CM | POA: Diagnosis not present

## 2022-08-13 DIAGNOSIS — K521 Toxic gastroenteritis and colitis: Secondary | ICD-10-CM | POA: Diagnosis not present

## 2022-08-13 DIAGNOSIS — K1230 Oral mucositis (ulcerative), unspecified: Secondary | ICD-10-CM | POA: Diagnosis not present

## 2022-08-13 DIAGNOSIS — R5081 Fever presenting with conditions classified elsewhere: Secondary | ICD-10-CM | POA: Diagnosis not present

## 2022-08-13 DIAGNOSIS — D709 Neutropenia, unspecified: Secondary | ICD-10-CM | POA: Diagnosis not present

## 2022-08-13 DIAGNOSIS — C9200 Acute myeloblastic leukemia, not having achieved remission: Secondary | ICD-10-CM | POA: Diagnosis not present

## 2022-08-14 DIAGNOSIS — K1230 Oral mucositis (ulcerative), unspecified: Secondary | ICD-10-CM | POA: Diagnosis not present

## 2022-08-14 DIAGNOSIS — C9200 Acute myeloblastic leukemia, not having achieved remission: Secondary | ICD-10-CM | POA: Diagnosis not present

## 2022-08-14 DIAGNOSIS — D709 Neutropenia, unspecified: Secondary | ICD-10-CM | POA: Diagnosis not present

## 2022-08-14 DIAGNOSIS — R5081 Fever presenting with conditions classified elsewhere: Secondary | ICD-10-CM | POA: Diagnosis not present

## 2022-08-14 DIAGNOSIS — B009 Herpesviral infection, unspecified: Secondary | ICD-10-CM | POA: Diagnosis not present

## 2022-08-14 DIAGNOSIS — R509 Fever, unspecified: Secondary | ICD-10-CM | POA: Diagnosis not present

## 2022-08-14 DIAGNOSIS — R197 Diarrhea, unspecified: Secondary | ICD-10-CM | POA: Diagnosis not present

## 2022-08-14 DIAGNOSIS — R918 Other nonspecific abnormal finding of lung field: Secondary | ICD-10-CM | POA: Diagnosis not present

## 2022-08-14 DIAGNOSIS — K521 Toxic gastroenteritis and colitis: Secondary | ICD-10-CM | POA: Diagnosis not present

## 2022-08-15 DIAGNOSIS — Z9484 Stem cells transplant status: Secondary | ICD-10-CM | POA: Diagnosis not present

## 2022-08-15 DIAGNOSIS — K1230 Oral mucositis (ulcerative), unspecified: Secondary | ICD-10-CM | POA: Diagnosis not present

## 2022-08-15 DIAGNOSIS — R5081 Fever presenting with conditions classified elsewhere: Secondary | ICD-10-CM | POA: Diagnosis not present

## 2022-08-15 DIAGNOSIS — C9201 Acute myeloblastic leukemia, in remission: Secondary | ICD-10-CM | POA: Diagnosis not present

## 2022-08-15 DIAGNOSIS — C9200 Acute myeloblastic leukemia, not having achieved remission: Secondary | ICD-10-CM | POA: Diagnosis not present

## 2022-08-15 DIAGNOSIS — D709 Neutropenia, unspecified: Secondary | ICD-10-CM | POA: Diagnosis not present

## 2022-08-15 DIAGNOSIS — K521 Toxic gastroenteritis and colitis: Secondary | ICD-10-CM | POA: Diagnosis not present

## 2022-08-15 DIAGNOSIS — B009 Herpesviral infection, unspecified: Secondary | ICD-10-CM | POA: Diagnosis not present

## 2022-08-15 DIAGNOSIS — D61810 Antineoplastic chemotherapy induced pancytopenia: Secondary | ICD-10-CM | POA: Diagnosis not present

## 2022-08-16 DIAGNOSIS — C9201 Acute myeloblastic leukemia, in remission: Secondary | ICD-10-CM | POA: Diagnosis not present

## 2022-08-16 DIAGNOSIS — D709 Neutropenia, unspecified: Secondary | ICD-10-CM | POA: Diagnosis not present

## 2022-08-16 DIAGNOSIS — N3091 Cystitis, unspecified with hematuria: Secondary | ICD-10-CM | POA: Diagnosis not present

## 2022-08-16 DIAGNOSIS — D696 Thrombocytopenia, unspecified: Secondary | ICD-10-CM | POA: Diagnosis not present

## 2022-08-16 DIAGNOSIS — R0602 Shortness of breath: Secondary | ICD-10-CM | POA: Diagnosis not present

## 2022-08-16 DIAGNOSIS — R5081 Fever presenting with conditions classified elsewhere: Secondary | ICD-10-CM | POA: Diagnosis not present

## 2022-08-16 DIAGNOSIS — R140 Abdominal distension (gaseous): Secondary | ICD-10-CM | POA: Diagnosis not present

## 2022-08-16 DIAGNOSIS — N1831 Chronic kidney disease, stage 3a: Secondary | ICD-10-CM | POA: Diagnosis not present

## 2022-08-16 DIAGNOSIS — K219 Gastro-esophageal reflux disease without esophagitis: Secondary | ICD-10-CM | POA: Diagnosis not present

## 2022-08-16 DIAGNOSIS — B957 Other staphylococcus as the cause of diseases classified elsewhere: Secondary | ICD-10-CM | POA: Diagnosis not present

## 2022-08-16 DIAGNOSIS — Z86711 Personal history of pulmonary embolism: Secondary | ICD-10-CM | POA: Diagnosis not present

## 2022-08-16 DIAGNOSIS — B009 Herpesviral infection, unspecified: Secondary | ICD-10-CM | POA: Diagnosis not present

## 2022-08-16 DIAGNOSIS — I081 Rheumatic disorders of both mitral and tricuspid valves: Secondary | ICD-10-CM | POA: Diagnosis not present

## 2022-08-16 DIAGNOSIS — K521 Toxic gastroenteritis and colitis: Secondary | ICD-10-CM | POA: Diagnosis not present

## 2022-08-16 DIAGNOSIS — K1230 Oral mucositis (ulcerative), unspecified: Secondary | ICD-10-CM | POA: Diagnosis not present

## 2022-08-17 DIAGNOSIS — K1230 Oral mucositis (ulcerative), unspecified: Secondary | ICD-10-CM | POA: Diagnosis not present

## 2022-08-17 DIAGNOSIS — N3091 Cystitis, unspecified with hematuria: Secondary | ICD-10-CM | POA: Diagnosis not present

## 2022-08-17 DIAGNOSIS — D630 Anemia in neoplastic disease: Secondary | ICD-10-CM | POA: Diagnosis not present

## 2022-08-17 DIAGNOSIS — R5081 Fever presenting with conditions classified elsewhere: Secondary | ICD-10-CM | POA: Diagnosis not present

## 2022-08-17 DIAGNOSIS — D696 Thrombocytopenia, unspecified: Secondary | ICD-10-CM | POA: Diagnosis not present

## 2022-08-17 DIAGNOSIS — D709 Neutropenia, unspecified: Secondary | ICD-10-CM | POA: Diagnosis not present

## 2022-08-17 DIAGNOSIS — C9201 Acute myeloblastic leukemia, in remission: Secondary | ICD-10-CM | POA: Diagnosis not present

## 2022-08-17 DIAGNOSIS — K219 Gastro-esophageal reflux disease without esophagitis: Secondary | ICD-10-CM | POA: Diagnosis not present

## 2022-08-17 DIAGNOSIS — K1231 Oral mucositis (ulcerative) due to antineoplastic therapy: Secondary | ICD-10-CM | POA: Diagnosis not present

## 2022-08-17 DIAGNOSIS — K521 Toxic gastroenteritis and colitis: Secondary | ICD-10-CM | POA: Diagnosis not present

## 2022-08-17 DIAGNOSIS — T451X5A Adverse effect of antineoplastic and immunosuppressive drugs, initial encounter: Secondary | ICD-10-CM | POA: Diagnosis not present

## 2022-08-17 DIAGNOSIS — R7881 Bacteremia: Secondary | ICD-10-CM | POA: Diagnosis not present

## 2022-08-17 DIAGNOSIS — B957 Other staphylococcus as the cause of diseases classified elsewhere: Secondary | ICD-10-CM | POA: Diagnosis not present

## 2022-08-17 DIAGNOSIS — R140 Abdominal distension (gaseous): Secondary | ICD-10-CM | POA: Diagnosis not present

## 2022-08-17 DIAGNOSIS — B338 Other specified viral diseases: Secondary | ICD-10-CM | POA: Diagnosis not present

## 2022-08-17 DIAGNOSIS — Z86711 Personal history of pulmonary embolism: Secondary | ICD-10-CM | POA: Diagnosis not present

## 2022-08-18 DIAGNOSIS — K1231 Oral mucositis (ulcerative) due to antineoplastic therapy: Secondary | ICD-10-CM | POA: Diagnosis not present

## 2022-08-18 DIAGNOSIS — C9201 Acute myeloblastic leukemia, in remission: Secondary | ICD-10-CM | POA: Diagnosis not present

## 2022-08-18 DIAGNOSIS — D696 Thrombocytopenia, unspecified: Secondary | ICD-10-CM | POA: Diagnosis not present

## 2022-08-18 DIAGNOSIS — D709 Neutropenia, unspecified: Secondary | ICD-10-CM | POA: Diagnosis not present

## 2022-08-18 DIAGNOSIS — N3289 Other specified disorders of bladder: Secondary | ICD-10-CM | POA: Diagnosis not present

## 2022-08-18 DIAGNOSIS — N3091 Cystitis, unspecified with hematuria: Secondary | ICD-10-CM | POA: Diagnosis not present

## 2022-08-18 DIAGNOSIS — R5081 Fever presenting with conditions classified elsewhere: Secondary | ICD-10-CM | POA: Diagnosis not present

## 2022-08-18 DIAGNOSIS — T451X5A Adverse effect of antineoplastic and immunosuppressive drugs, initial encounter: Secondary | ICD-10-CM | POA: Diagnosis not present

## 2022-08-18 DIAGNOSIS — R140 Abdominal distension (gaseous): Secondary | ICD-10-CM | POA: Diagnosis not present

## 2022-08-18 DIAGNOSIS — D630 Anemia in neoplastic disease: Secondary | ICD-10-CM | POA: Diagnosis not present

## 2022-08-18 DIAGNOSIS — B957 Other staphylococcus as the cause of diseases classified elsewhere: Secondary | ICD-10-CM | POA: Diagnosis not present

## 2022-08-18 DIAGNOSIS — K219 Gastro-esophageal reflux disease without esophagitis: Secondary | ICD-10-CM | POA: Diagnosis not present

## 2022-08-18 DIAGNOSIS — B338 Other specified viral diseases: Secondary | ICD-10-CM | POA: Diagnosis not present

## 2022-08-18 DIAGNOSIS — Z86711 Personal history of pulmonary embolism: Secondary | ICD-10-CM | POA: Diagnosis not present

## 2022-08-18 DIAGNOSIS — K1230 Oral mucositis (ulcerative), unspecified: Secondary | ICD-10-CM | POA: Diagnosis not present

## 2022-08-18 DIAGNOSIS — R7881 Bacteremia: Secondary | ICD-10-CM | POA: Diagnosis not present

## 2022-08-19 DIAGNOSIS — R7881 Bacteremia: Secondary | ICD-10-CM | POA: Diagnosis not present

## 2022-08-19 DIAGNOSIS — D696 Thrombocytopenia, unspecified: Secondary | ICD-10-CM | POA: Diagnosis not present

## 2022-08-19 DIAGNOSIS — K1230 Oral mucositis (ulcerative), unspecified: Secondary | ICD-10-CM | POA: Diagnosis not present

## 2022-08-19 DIAGNOSIS — K219 Gastro-esophageal reflux disease without esophagitis: Secondary | ICD-10-CM | POA: Diagnosis not present

## 2022-08-19 DIAGNOSIS — C9201 Acute myeloblastic leukemia, in remission: Secondary | ICD-10-CM | POA: Diagnosis not present

## 2022-08-19 DIAGNOSIS — N3289 Other specified disorders of bladder: Secondary | ICD-10-CM | POA: Diagnosis not present

## 2022-08-19 DIAGNOSIS — B338 Other specified viral diseases: Secondary | ICD-10-CM | POA: Diagnosis not present

## 2022-08-19 DIAGNOSIS — K1231 Oral mucositis (ulcerative) due to antineoplastic therapy: Secondary | ICD-10-CM | POA: Diagnosis not present

## 2022-08-19 DIAGNOSIS — N3091 Cystitis, unspecified with hematuria: Secondary | ICD-10-CM | POA: Diagnosis not present

## 2022-08-19 DIAGNOSIS — R140 Abdominal distension (gaseous): Secondary | ICD-10-CM | POA: Diagnosis not present

## 2022-08-19 DIAGNOSIS — Z86711 Personal history of pulmonary embolism: Secondary | ICD-10-CM | POA: Diagnosis not present

## 2022-08-19 DIAGNOSIS — T451X5A Adverse effect of antineoplastic and immunosuppressive drugs, initial encounter: Secondary | ICD-10-CM | POA: Diagnosis not present

## 2022-08-19 DIAGNOSIS — D709 Neutropenia, unspecified: Secondary | ICD-10-CM | POA: Diagnosis not present

## 2022-08-19 DIAGNOSIS — B957 Other staphylococcus as the cause of diseases classified elsewhere: Secondary | ICD-10-CM | POA: Diagnosis not present

## 2022-08-19 DIAGNOSIS — R5081 Fever presenting with conditions classified elsewhere: Secondary | ICD-10-CM | POA: Diagnosis not present

## 2022-08-19 DIAGNOSIS — D630 Anemia in neoplastic disease: Secondary | ICD-10-CM | POA: Diagnosis not present

## 2022-08-20 DIAGNOSIS — D630 Anemia in neoplastic disease: Secondary | ICD-10-CM | POA: Diagnosis not present

## 2022-08-20 DIAGNOSIS — T451X5A Adverse effect of antineoplastic and immunosuppressive drugs, initial encounter: Secondary | ICD-10-CM | POA: Diagnosis not present

## 2022-08-20 DIAGNOSIS — B957 Other staphylococcus as the cause of diseases classified elsewhere: Secondary | ICD-10-CM | POA: Diagnosis not present

## 2022-08-20 DIAGNOSIS — K1231 Oral mucositis (ulcerative) due to antineoplastic therapy: Secondary | ICD-10-CM | POA: Diagnosis not present

## 2022-08-20 DIAGNOSIS — R5081 Fever presenting with conditions classified elsewhere: Secondary | ICD-10-CM | POA: Diagnosis not present

## 2022-08-20 DIAGNOSIS — B338 Other specified viral diseases: Secondary | ICD-10-CM | POA: Diagnosis not present

## 2022-08-20 DIAGNOSIS — N3091 Cystitis, unspecified with hematuria: Secondary | ICD-10-CM | POA: Diagnosis not present

## 2022-08-20 DIAGNOSIS — C9201 Acute myeloblastic leukemia, in remission: Secondary | ICD-10-CM | POA: Diagnosis not present

## 2022-08-20 DIAGNOSIS — D709 Neutropenia, unspecified: Secondary | ICD-10-CM | POA: Diagnosis not present

## 2022-08-20 DIAGNOSIS — D696 Thrombocytopenia, unspecified: Secondary | ICD-10-CM | POA: Diagnosis not present

## 2022-08-20 DIAGNOSIS — N3289 Other specified disorders of bladder: Secondary | ICD-10-CM | POA: Diagnosis not present

## 2022-08-20 DIAGNOSIS — E876 Hypokalemia: Secondary | ICD-10-CM | POA: Diagnosis not present

## 2022-08-20 DIAGNOSIS — R7881 Bacteremia: Secondary | ICD-10-CM | POA: Diagnosis not present

## 2022-08-21 DIAGNOSIS — R140 Abdominal distension (gaseous): Secondary | ICD-10-CM | POA: Diagnosis not present

## 2022-08-21 DIAGNOSIS — H1189 Other specified disorders of conjunctiva: Secondary | ICD-10-CM | POA: Diagnosis not present

## 2022-08-21 DIAGNOSIS — N3289 Other specified disorders of bladder: Secondary | ICD-10-CM | POA: Diagnosis not present

## 2022-08-21 DIAGNOSIS — K1230 Oral mucositis (ulcerative), unspecified: Secondary | ICD-10-CM | POA: Diagnosis not present

## 2022-08-21 DIAGNOSIS — K219 Gastro-esophageal reflux disease without esophagitis: Secondary | ICD-10-CM | POA: Diagnosis not present

## 2022-08-21 DIAGNOSIS — C9201 Acute myeloblastic leukemia, in remission: Secondary | ICD-10-CM | POA: Diagnosis not present

## 2022-08-21 DIAGNOSIS — N3091 Cystitis, unspecified with hematuria: Secondary | ICD-10-CM | POA: Diagnosis not present

## 2022-08-21 DIAGNOSIS — E876 Hypokalemia: Secondary | ICD-10-CM | POA: Diagnosis not present

## 2022-08-22 DIAGNOSIS — R140 Abdominal distension (gaseous): Secondary | ICD-10-CM | POA: Diagnosis not present

## 2022-08-22 DIAGNOSIS — D696 Thrombocytopenia, unspecified: Secondary | ICD-10-CM | POA: Diagnosis not present

## 2022-08-22 DIAGNOSIS — C9201 Acute myeloblastic leukemia, in remission: Secondary | ICD-10-CM | POA: Diagnosis not present

## 2022-08-22 DIAGNOSIS — Z86711 Personal history of pulmonary embolism: Secondary | ICD-10-CM | POA: Diagnosis not present

## 2022-08-22 DIAGNOSIS — T451X5A Adverse effect of antineoplastic and immunosuppressive drugs, initial encounter: Secondary | ICD-10-CM | POA: Diagnosis not present

## 2022-08-22 DIAGNOSIS — B338 Other specified viral diseases: Secondary | ICD-10-CM | POA: Diagnosis not present

## 2022-08-22 DIAGNOSIS — N3289 Other specified disorders of bladder: Secondary | ICD-10-CM | POA: Diagnosis not present

## 2022-08-22 DIAGNOSIS — K1231 Oral mucositis (ulcerative) due to antineoplastic therapy: Secondary | ICD-10-CM | POA: Diagnosis not present

## 2022-08-22 DIAGNOSIS — R5081 Fever presenting with conditions classified elsewhere: Secondary | ICD-10-CM | POA: Diagnosis not present

## 2022-08-22 DIAGNOSIS — N3091 Cystitis, unspecified with hematuria: Secondary | ICD-10-CM | POA: Diagnosis not present

## 2022-08-22 DIAGNOSIS — K219 Gastro-esophageal reflux disease without esophagitis: Secondary | ICD-10-CM | POA: Diagnosis not present

## 2022-08-22 DIAGNOSIS — D709 Neutropenia, unspecified: Secondary | ICD-10-CM | POA: Diagnosis not present

## 2022-08-22 DIAGNOSIS — R7881 Bacteremia: Secondary | ICD-10-CM | POA: Diagnosis not present

## 2022-08-22 DIAGNOSIS — E876 Hypokalemia: Secondary | ICD-10-CM | POA: Diagnosis not present

## 2022-08-22 DIAGNOSIS — G9341 Metabolic encephalopathy: Secondary | ICD-10-CM | POA: Diagnosis not present

## 2022-08-22 DIAGNOSIS — D630 Anemia in neoplastic disease: Secondary | ICD-10-CM | POA: Diagnosis not present

## 2022-08-22 DIAGNOSIS — B957 Other staphylococcus as the cause of diseases classified elsewhere: Secondary | ICD-10-CM | POA: Diagnosis not present

## 2022-08-23 DIAGNOSIS — K1231 Oral mucositis (ulcerative) due to antineoplastic therapy: Secondary | ICD-10-CM | POA: Diagnosis not present

## 2022-08-23 DIAGNOSIS — D709 Neutropenia, unspecified: Secondary | ICD-10-CM | POA: Diagnosis not present

## 2022-08-23 DIAGNOSIS — R7881 Bacteremia: Secondary | ICD-10-CM | POA: Diagnosis not present

## 2022-08-23 DIAGNOSIS — E876 Hypokalemia: Secondary | ICD-10-CM | POA: Diagnosis not present

## 2022-08-23 DIAGNOSIS — D696 Thrombocytopenia, unspecified: Secondary | ICD-10-CM | POA: Diagnosis not present

## 2022-08-23 DIAGNOSIS — R5081 Fever presenting with conditions classified elsewhere: Secondary | ICD-10-CM | POA: Diagnosis not present

## 2022-08-23 DIAGNOSIS — G9341 Metabolic encephalopathy: Secondary | ICD-10-CM | POA: Diagnosis not present

## 2022-08-23 DIAGNOSIS — K219 Gastro-esophageal reflux disease without esophagitis: Secondary | ICD-10-CM | POA: Diagnosis not present

## 2022-08-23 DIAGNOSIS — C9201 Acute myeloblastic leukemia, in remission: Secondary | ICD-10-CM | POA: Diagnosis not present

## 2022-08-23 DIAGNOSIS — R140 Abdominal distension (gaseous): Secondary | ICD-10-CM | POA: Diagnosis not present

## 2022-08-23 DIAGNOSIS — Z86711 Personal history of pulmonary embolism: Secondary | ICD-10-CM | POA: Diagnosis not present

## 2022-08-23 DIAGNOSIS — B957 Other staphylococcus as the cause of diseases classified elsewhere: Secondary | ICD-10-CM | POA: Diagnosis not present

## 2022-08-23 DIAGNOSIS — B338 Other specified viral diseases: Secondary | ICD-10-CM | POA: Diagnosis not present

## 2022-08-23 DIAGNOSIS — D630 Anemia in neoplastic disease: Secondary | ICD-10-CM | POA: Diagnosis not present

## 2022-08-23 DIAGNOSIS — T451X5A Adverse effect of antineoplastic and immunosuppressive drugs, initial encounter: Secondary | ICD-10-CM | POA: Diagnosis not present

## 2022-08-23 DIAGNOSIS — N3091 Cystitis, unspecified with hematuria: Secondary | ICD-10-CM | POA: Diagnosis not present

## 2022-08-23 DIAGNOSIS — N3289 Other specified disorders of bladder: Secondary | ICD-10-CM | POA: Diagnosis not present

## 2022-08-24 DIAGNOSIS — D709 Neutropenia, unspecified: Secondary | ICD-10-CM | POA: Diagnosis not present

## 2022-08-24 DIAGNOSIS — Z86711 Personal history of pulmonary embolism: Secondary | ICD-10-CM | POA: Diagnosis not present

## 2022-08-24 DIAGNOSIS — N3091 Cystitis, unspecified with hematuria: Secondary | ICD-10-CM | POA: Diagnosis not present

## 2022-08-24 DIAGNOSIS — R0689 Other abnormalities of breathing: Secondary | ICD-10-CM | POA: Diagnosis not present

## 2022-08-24 DIAGNOSIS — R8279 Other abnormal findings on microbiological examination of urine: Secondary | ICD-10-CM | POA: Diagnosis not present

## 2022-08-24 DIAGNOSIS — C9201 Acute myeloblastic leukemia, in remission: Secondary | ICD-10-CM | POA: Diagnosis not present

## 2022-08-24 DIAGNOSIS — N3081 Other cystitis with hematuria: Secondary | ICD-10-CM | POA: Diagnosis not present

## 2022-08-24 DIAGNOSIS — K219 Gastro-esophageal reflux disease without esophagitis: Secondary | ICD-10-CM | POA: Diagnosis not present

## 2022-08-24 DIAGNOSIS — E876 Hypokalemia: Secondary | ICD-10-CM | POA: Diagnosis not present

## 2022-08-24 DIAGNOSIS — R5081 Fever presenting with conditions classified elsewhere: Secondary | ICD-10-CM | POA: Diagnosis not present

## 2022-08-24 DIAGNOSIS — G9341 Metabolic encephalopathy: Secondary | ICD-10-CM | POA: Diagnosis not present

## 2022-08-24 DIAGNOSIS — R140 Abdominal distension (gaseous): Secondary | ICD-10-CM | POA: Diagnosis not present

## 2022-08-24 DIAGNOSIS — D61818 Other pancytopenia: Secondary | ICD-10-CM | POA: Diagnosis not present

## 2022-08-24 DIAGNOSIS — C9200 Acute myeloblastic leukemia, not having achieved remission: Secondary | ICD-10-CM | POA: Diagnosis not present

## 2022-08-24 DIAGNOSIS — Z9481 Bone marrow transplant status: Secondary | ICD-10-CM | POA: Diagnosis not present

## 2022-08-25 DIAGNOSIS — Z86711 Personal history of pulmonary embolism: Secondary | ICD-10-CM | POA: Diagnosis not present

## 2022-08-25 DIAGNOSIS — N3091 Cystitis, unspecified with hematuria: Secondary | ICD-10-CM | POA: Diagnosis not present

## 2022-08-25 DIAGNOSIS — J90 Pleural effusion, not elsewhere classified: Secondary | ICD-10-CM | POA: Diagnosis not present

## 2022-08-25 DIAGNOSIS — R140 Abdominal distension (gaseous): Secondary | ICD-10-CM | POA: Diagnosis not present

## 2022-08-25 DIAGNOSIS — C9200 Acute myeloblastic leukemia, not having achieved remission: Secondary | ICD-10-CM | POA: Diagnosis not present

## 2022-08-25 DIAGNOSIS — R5081 Fever presenting with conditions classified elsewhere: Secondary | ICD-10-CM | POA: Diagnosis not present

## 2022-08-25 DIAGNOSIS — K219 Gastro-esophageal reflux disease without esophagitis: Secondary | ICD-10-CM | POA: Diagnosis not present

## 2022-08-25 DIAGNOSIS — C9201 Acute myeloblastic leukemia, in remission: Secondary | ICD-10-CM | POA: Diagnosis not present

## 2022-08-25 DIAGNOSIS — D709 Neutropenia, unspecified: Secondary | ICD-10-CM | POA: Diagnosis not present

## 2022-08-25 DIAGNOSIS — D61818 Other pancytopenia: Secondary | ICD-10-CM | POA: Diagnosis not present

## 2022-08-25 DIAGNOSIS — G9341 Metabolic encephalopathy: Secondary | ICD-10-CM | POA: Diagnosis not present

## 2022-08-25 DIAGNOSIS — E876 Hypokalemia: Secondary | ICD-10-CM | POA: Diagnosis not present

## 2022-08-25 DIAGNOSIS — R8279 Other abnormal findings on microbiological examination of urine: Secondary | ICD-10-CM | POA: Diagnosis not present

## 2022-08-25 DIAGNOSIS — N3289 Other specified disorders of bladder: Secondary | ICD-10-CM | POA: Diagnosis not present

## 2022-08-25 DIAGNOSIS — Z9481 Bone marrow transplant status: Secondary | ICD-10-CM | POA: Diagnosis not present

## 2022-08-26 DIAGNOSIS — D61818 Other pancytopenia: Secondary | ICD-10-CM | POA: Diagnosis not present

## 2022-08-26 DIAGNOSIS — K7689 Other specified diseases of liver: Secondary | ICD-10-CM | POA: Diagnosis not present

## 2022-08-26 DIAGNOSIS — K219 Gastro-esophageal reflux disease without esophagitis: Secondary | ICD-10-CM | POA: Diagnosis not present

## 2022-08-26 DIAGNOSIS — E876 Hypokalemia: Secondary | ICD-10-CM | POA: Diagnosis not present

## 2022-08-26 DIAGNOSIS — C9200 Acute myeloblastic leukemia, not having achieved remission: Secondary | ICD-10-CM | POA: Diagnosis not present

## 2022-08-26 DIAGNOSIS — Z86711 Personal history of pulmonary embolism: Secondary | ICD-10-CM | POA: Diagnosis not present

## 2022-08-26 DIAGNOSIS — C9201 Acute myeloblastic leukemia, in remission: Secondary | ICD-10-CM | POA: Diagnosis not present

## 2022-08-26 DIAGNOSIS — R8279 Other abnormal findings on microbiological examination of urine: Secondary | ICD-10-CM | POA: Diagnosis not present

## 2022-08-26 DIAGNOSIS — D709 Neutropenia, unspecified: Secondary | ICD-10-CM | POA: Diagnosis not present

## 2022-08-26 DIAGNOSIS — G9341 Metabolic encephalopathy: Secondary | ICD-10-CM | POA: Diagnosis not present

## 2022-08-26 DIAGNOSIS — R140 Abdominal distension (gaseous): Secondary | ICD-10-CM | POA: Diagnosis not present

## 2022-08-26 DIAGNOSIS — R5081 Fever presenting with conditions classified elsewhere: Secondary | ICD-10-CM | POA: Diagnosis not present

## 2022-08-26 DIAGNOSIS — N3091 Cystitis, unspecified with hematuria: Secondary | ICD-10-CM | POA: Diagnosis not present

## 2022-08-26 DIAGNOSIS — Z9481 Bone marrow transplant status: Secondary | ICD-10-CM | POA: Diagnosis not present

## 2022-08-27 DIAGNOSIS — C9201 Acute myeloblastic leukemia, in remission: Secondary | ICD-10-CM | POA: Diagnosis not present

## 2022-08-27 DIAGNOSIS — R451 Restlessness and agitation: Secondary | ICD-10-CM | POA: Diagnosis not present

## 2022-08-27 DIAGNOSIS — G9341 Metabolic encephalopathy: Secondary | ICD-10-CM | POA: Diagnosis not present

## 2022-08-27 DIAGNOSIS — R8279 Other abnormal findings on microbiological examination of urine: Secondary | ICD-10-CM | POA: Diagnosis not present

## 2022-08-27 DIAGNOSIS — Z9481 Bone marrow transplant status: Secondary | ICD-10-CM | POA: Diagnosis not present

## 2022-08-27 DIAGNOSIS — C9200 Acute myeloblastic leukemia, not having achieved remission: Secondary | ICD-10-CM | POA: Diagnosis not present

## 2022-08-27 DIAGNOSIS — D61818 Other pancytopenia: Secondary | ICD-10-CM | POA: Diagnosis not present

## 2022-08-28 DIAGNOSIS — Z9481 Bone marrow transplant status: Secondary | ICD-10-CM | POA: Diagnosis not present

## 2022-08-28 DIAGNOSIS — R8279 Other abnormal findings on microbiological examination of urine: Secondary | ICD-10-CM | POA: Diagnosis not present

## 2022-08-28 DIAGNOSIS — R451 Restlessness and agitation: Secondary | ICD-10-CM | POA: Diagnosis not present

## 2022-08-28 DIAGNOSIS — R140 Abdominal distension (gaseous): Secondary | ICD-10-CM | POA: Diagnosis not present

## 2022-08-28 DIAGNOSIS — G9341 Metabolic encephalopathy: Secondary | ICD-10-CM | POA: Diagnosis not present

## 2022-08-28 DIAGNOSIS — C9200 Acute myeloblastic leukemia, not having achieved remission: Secondary | ICD-10-CM | POA: Diagnosis not present

## 2022-08-28 DIAGNOSIS — C9201 Acute myeloblastic leukemia, in remission: Secondary | ICD-10-CM | POA: Diagnosis not present

## 2022-08-28 DIAGNOSIS — D61818 Other pancytopenia: Secondary | ICD-10-CM | POA: Diagnosis not present

## 2022-08-29 DIAGNOSIS — C9200 Acute myeloblastic leukemia, not having achieved remission: Secondary | ICD-10-CM | POA: Diagnosis not present

## 2022-08-29 DIAGNOSIS — C9201 Acute myeloblastic leukemia, in remission: Secondary | ICD-10-CM | POA: Diagnosis not present

## 2022-08-29 DIAGNOSIS — G9341 Metabolic encephalopathy: Secondary | ICD-10-CM | POA: Diagnosis not present

## 2022-08-29 DIAGNOSIS — Z9481 Bone marrow transplant status: Secondary | ICD-10-CM | POA: Diagnosis not present

## 2022-08-29 DIAGNOSIS — R451 Restlessness and agitation: Secondary | ICD-10-CM | POA: Diagnosis not present

## 2022-08-29 DIAGNOSIS — R8279 Other abnormal findings on microbiological examination of urine: Secondary | ICD-10-CM | POA: Diagnosis not present

## 2022-08-29 DIAGNOSIS — D61818 Other pancytopenia: Secondary | ICD-10-CM | POA: Diagnosis not present

## 2022-08-30 DIAGNOSIS — Z9484 Stem cells transplant status: Secondary | ICD-10-CM | POA: Diagnosis not present

## 2022-08-30 DIAGNOSIS — G9341 Metabolic encephalopathy: Secondary | ICD-10-CM | POA: Diagnosis not present

## 2022-08-30 DIAGNOSIS — N3091 Cystitis, unspecified with hematuria: Secondary | ICD-10-CM | POA: Diagnosis not present

## 2022-08-30 DIAGNOSIS — K1231 Oral mucositis (ulcerative) due to antineoplastic therapy: Secondary | ICD-10-CM | POA: Diagnosis not present

## 2022-08-30 DIAGNOSIS — D61818 Other pancytopenia: Secondary | ICD-10-CM | POA: Diagnosis not present

## 2022-08-30 DIAGNOSIS — Z9481 Bone marrow transplant status: Secondary | ICD-10-CM | POA: Diagnosis not present

## 2022-08-30 DIAGNOSIS — R8279 Other abnormal findings on microbiological examination of urine: Secondary | ICD-10-CM | POA: Diagnosis not present

## 2022-08-30 DIAGNOSIS — C9200 Acute myeloblastic leukemia, not having achieved remission: Secondary | ICD-10-CM | POA: Diagnosis not present

## 2022-08-31 DIAGNOSIS — K1231 Oral mucositis (ulcerative) due to antineoplastic therapy: Secondary | ICD-10-CM | POA: Diagnosis not present

## 2022-08-31 DIAGNOSIS — C9200 Acute myeloblastic leukemia, not having achieved remission: Secondary | ICD-10-CM | POA: Diagnosis not present

## 2022-08-31 DIAGNOSIS — G9341 Metabolic encephalopathy: Secondary | ICD-10-CM | POA: Diagnosis not present

## 2022-08-31 DIAGNOSIS — N3091 Cystitis, unspecified with hematuria: Secondary | ICD-10-CM | POA: Diagnosis not present

## 2022-08-31 DIAGNOSIS — Z9484 Stem cells transplant status: Secondary | ICD-10-CM | POA: Diagnosis not present

## 2022-09-01 DIAGNOSIS — G9341 Metabolic encephalopathy: Secondary | ICD-10-CM | POA: Diagnosis not present

## 2022-09-01 DIAGNOSIS — K567 Ileus, unspecified: Secondary | ICD-10-CM | POA: Diagnosis not present

## 2022-09-01 DIAGNOSIS — Z9484 Stem cells transplant status: Secondary | ICD-10-CM | POA: Diagnosis not present

## 2022-09-01 DIAGNOSIS — C9200 Acute myeloblastic leukemia, not having achieved remission: Secondary | ICD-10-CM | POA: Diagnosis not present

## 2022-09-01 DIAGNOSIS — N3091 Cystitis, unspecified with hematuria: Secondary | ICD-10-CM | POA: Diagnosis not present

## 2022-09-01 DIAGNOSIS — K1231 Oral mucositis (ulcerative) due to antineoplastic therapy: Secondary | ICD-10-CM | POA: Diagnosis not present

## 2022-09-02 DIAGNOSIS — C9200 Acute myeloblastic leukemia, not having achieved remission: Secondary | ICD-10-CM | POA: Diagnosis not present

## 2022-09-02 DIAGNOSIS — Z9484 Stem cells transplant status: Secondary | ICD-10-CM | POA: Diagnosis not present

## 2022-09-02 DIAGNOSIS — N3091 Cystitis, unspecified with hematuria: Secondary | ICD-10-CM | POA: Diagnosis not present

## 2022-09-02 DIAGNOSIS — K1231 Oral mucositis (ulcerative) due to antineoplastic therapy: Secondary | ICD-10-CM | POA: Diagnosis not present

## 2022-09-02 DIAGNOSIS — N281 Cyst of kidney, acquired: Secondary | ICD-10-CM | POA: Diagnosis not present

## 2022-09-02 DIAGNOSIS — G9341 Metabolic encephalopathy: Secondary | ICD-10-CM | POA: Diagnosis not present

## 2022-09-03 DIAGNOSIS — G9341 Metabolic encephalopathy: Secondary | ICD-10-CM | POA: Diagnosis not present

## 2022-09-03 DIAGNOSIS — C9200 Acute myeloblastic leukemia, not having achieved remission: Secondary | ICD-10-CM | POA: Diagnosis not present

## 2022-09-03 DIAGNOSIS — Z9484 Stem cells transplant status: Secondary | ICD-10-CM | POA: Diagnosis not present

## 2022-09-03 DIAGNOSIS — K1231 Oral mucositis (ulcerative) due to antineoplastic therapy: Secondary | ICD-10-CM | POA: Diagnosis not present

## 2022-09-03 DIAGNOSIS — N3091 Cystitis, unspecified with hematuria: Secondary | ICD-10-CM | POA: Diagnosis not present

## 2022-09-04 DIAGNOSIS — N3091 Cystitis, unspecified with hematuria: Secondary | ICD-10-CM | POA: Diagnosis not present

## 2022-09-04 DIAGNOSIS — G9341 Metabolic encephalopathy: Secondary | ICD-10-CM | POA: Diagnosis not present

## 2022-09-04 DIAGNOSIS — K1231 Oral mucositis (ulcerative) due to antineoplastic therapy: Secondary | ICD-10-CM | POA: Diagnosis not present

## 2022-09-04 DIAGNOSIS — Z9484 Stem cells transplant status: Secondary | ICD-10-CM | POA: Diagnosis not present

## 2022-09-04 DIAGNOSIS — C9200 Acute myeloblastic leukemia, not having achieved remission: Secondary | ICD-10-CM | POA: Diagnosis not present

## 2022-09-05 DIAGNOSIS — G9341 Metabolic encephalopathy: Secondary | ICD-10-CM | POA: Diagnosis not present

## 2022-09-05 DIAGNOSIS — N3091 Cystitis, unspecified with hematuria: Secondary | ICD-10-CM | POA: Diagnosis not present

## 2022-09-05 DIAGNOSIS — Z9484 Stem cells transplant status: Secondary | ICD-10-CM | POA: Diagnosis not present

## 2022-09-05 DIAGNOSIS — C9200 Acute myeloblastic leukemia, not having achieved remission: Secondary | ICD-10-CM | POA: Diagnosis not present

## 2022-09-05 DIAGNOSIS — K1231 Oral mucositis (ulcerative) due to antineoplastic therapy: Secondary | ICD-10-CM | POA: Diagnosis not present

## 2022-09-06 DIAGNOSIS — C9200 Acute myeloblastic leukemia, not having achieved remission: Secondary | ICD-10-CM | POA: Diagnosis not present

## 2022-09-06 DIAGNOSIS — C9201 Acute myeloblastic leukemia, in remission: Secondary | ICD-10-CM | POA: Diagnosis not present

## 2022-09-07 DIAGNOSIS — Z4659 Encounter for fitting and adjustment of other gastrointestinal appliance and device: Secondary | ICD-10-CM | POA: Diagnosis not present

## 2022-09-07 DIAGNOSIS — C9200 Acute myeloblastic leukemia, not having achieved remission: Secondary | ICD-10-CM | POA: Diagnosis not present

## 2022-09-07 DIAGNOSIS — C9201 Acute myeloblastic leukemia, in remission: Secondary | ICD-10-CM | POA: Diagnosis not present

## 2022-09-08 DIAGNOSIS — C9201 Acute myeloblastic leukemia, in remission: Secondary | ICD-10-CM | POA: Diagnosis not present

## 2022-09-08 DIAGNOSIS — C9200 Acute myeloblastic leukemia, not having achieved remission: Secondary | ICD-10-CM | POA: Diagnosis not present

## 2022-09-09 DIAGNOSIS — Z83711 Family history of hyperplastic colon polyps: Secondary | ICD-10-CM | POA: Diagnosis not present

## 2022-09-09 DIAGNOSIS — M25511 Pain in right shoulder: Secondary | ICD-10-CM | POA: Diagnosis not present

## 2022-09-09 DIAGNOSIS — Z934 Other artificial openings of gastrointestinal tract status: Secondary | ICD-10-CM | POA: Diagnosis not present

## 2022-09-09 DIAGNOSIS — G4700 Insomnia, unspecified: Secondary | ICD-10-CM | POA: Diagnosis not present

## 2022-09-09 DIAGNOSIS — N1831 Chronic kidney disease, stage 3a: Secondary | ICD-10-CM | POA: Diagnosis not present

## 2022-09-09 DIAGNOSIS — R000 Tachycardia, unspecified: Secondary | ICD-10-CM | POA: Diagnosis not present

## 2022-09-09 DIAGNOSIS — M25512 Pain in left shoulder: Secondary | ICD-10-CM | POA: Diagnosis not present

## 2022-09-09 DIAGNOSIS — N3289 Other specified disorders of bladder: Secondary | ICD-10-CM | POA: Diagnosis not present

## 2022-09-09 DIAGNOSIS — C9201 Acute myeloblastic leukemia, in remission: Secondary | ICD-10-CM | POA: Diagnosis not present

## 2022-09-09 DIAGNOSIS — I129 Hypertensive chronic kidney disease with stage 1 through stage 4 chronic kidney disease, or unspecified chronic kidney disease: Secondary | ICD-10-CM | POA: Diagnosis not present

## 2022-09-10 DIAGNOSIS — I129 Hypertensive chronic kidney disease with stage 1 through stage 4 chronic kidney disease, or unspecified chronic kidney disease: Secondary | ICD-10-CM | POA: Diagnosis not present

## 2022-09-10 DIAGNOSIS — Z934 Other artificial openings of gastrointestinal tract status: Secondary | ICD-10-CM | POA: Diagnosis not present

## 2022-09-10 DIAGNOSIS — C9201 Acute myeloblastic leukemia, in remission: Secondary | ICD-10-CM | POA: Diagnosis not present

## 2022-09-10 DIAGNOSIS — M25512 Pain in left shoulder: Secondary | ICD-10-CM | POA: Diagnosis not present

## 2022-09-10 DIAGNOSIS — N1831 Chronic kidney disease, stage 3a: Secondary | ICD-10-CM | POA: Diagnosis not present

## 2022-09-10 DIAGNOSIS — Z83711 Family history of hyperplastic colon polyps: Secondary | ICD-10-CM | POA: Diagnosis not present

## 2022-09-10 DIAGNOSIS — M25511 Pain in right shoulder: Secondary | ICD-10-CM | POA: Diagnosis not present

## 2022-09-10 DIAGNOSIS — G4700 Insomnia, unspecified: Secondary | ICD-10-CM | POA: Diagnosis not present

## 2022-09-10 DIAGNOSIS — R000 Tachycardia, unspecified: Secondary | ICD-10-CM | POA: Diagnosis not present

## 2022-09-10 DIAGNOSIS — N3289 Other specified disorders of bladder: Secondary | ICD-10-CM | POA: Diagnosis not present

## 2022-09-11 DIAGNOSIS — C9200 Acute myeloblastic leukemia, not having achieved remission: Secondary | ICD-10-CM | POA: Diagnosis not present

## 2022-09-12 DIAGNOSIS — C9201 Acute myeloblastic leukemia, in remission: Secondary | ICD-10-CM | POA: Diagnosis not present

## 2022-09-12 DIAGNOSIS — R633 Feeding difficulties, unspecified: Secondary | ICD-10-CM | POA: Diagnosis not present

## 2022-09-12 DIAGNOSIS — R1312 Dysphagia, oropharyngeal phase: Secondary | ICD-10-CM | POA: Diagnosis not present

## 2022-09-13 DIAGNOSIS — C9201 Acute myeloblastic leukemia, in remission: Secondary | ICD-10-CM | POA: Diagnosis not present

## 2022-09-14 DIAGNOSIS — C9201 Acute myeloblastic leukemia, in remission: Secondary | ICD-10-CM | POA: Diagnosis not present

## 2022-09-15 DIAGNOSIS — C9201 Acute myeloblastic leukemia, in remission: Secondary | ICD-10-CM | POA: Diagnosis not present

## 2022-09-15 DIAGNOSIS — Z4659 Encounter for fitting and adjustment of other gastrointestinal appliance and device: Secondary | ICD-10-CM | POA: Diagnosis not present

## 2022-09-15 DIAGNOSIS — C9200 Acute myeloblastic leukemia, not having achieved remission: Secondary | ICD-10-CM | POA: Diagnosis not present

## 2022-09-16 DIAGNOSIS — C9201 Acute myeloblastic leukemia, in remission: Secondary | ICD-10-CM | POA: Diagnosis not present

## 2022-09-17 DIAGNOSIS — R1319 Other dysphagia: Secondary | ICD-10-CM | POA: Diagnosis not present

## 2022-09-17 DIAGNOSIS — C9201 Acute myeloblastic leukemia, in remission: Secondary | ICD-10-CM | POA: Diagnosis not present

## 2022-09-17 DIAGNOSIS — R633 Feeding difficulties, unspecified: Secondary | ICD-10-CM | POA: Diagnosis not present

## 2022-09-18 DIAGNOSIS — C9201 Acute myeloblastic leukemia, in remission: Secondary | ICD-10-CM | POA: Diagnosis not present

## 2022-09-19 DIAGNOSIS — C9201 Acute myeloblastic leukemia, in remission: Secondary | ICD-10-CM | POA: Diagnosis not present

## 2022-09-21 DIAGNOSIS — C9400 Acute erythroid leukemia, not having achieved remission: Secondary | ICD-10-CM | POA: Diagnosis not present

## 2022-09-21 DIAGNOSIS — C9200 Acute myeloblastic leukemia, not having achieved remission: Secondary | ICD-10-CM | POA: Diagnosis not present

## 2022-09-21 DIAGNOSIS — Z9484 Stem cells transplant status: Secondary | ICD-10-CM | POA: Diagnosis not present

## 2022-09-21 DIAGNOSIS — R1310 Dysphagia, unspecified: Secondary | ICD-10-CM | POA: Diagnosis not present

## 2022-09-22 DIAGNOSIS — C9201 Acute myeloblastic leukemia, in remission: Secondary | ICD-10-CM | POA: Diagnosis not present

## 2022-09-22 DIAGNOSIS — Z9481 Bone marrow transplant status: Secondary | ICD-10-CM | POA: Diagnosis not present

## 2022-09-23 DIAGNOSIS — Z9484 Stem cells transplant status: Secondary | ICD-10-CM | POA: Diagnosis not present

## 2022-09-23 DIAGNOSIS — N1831 Chronic kidney disease, stage 3a: Secondary | ICD-10-CM | POA: Diagnosis not present

## 2022-09-23 DIAGNOSIS — Z79891 Long term (current) use of opiate analgesic: Secondary | ICD-10-CM | POA: Diagnosis not present

## 2022-09-23 DIAGNOSIS — Z792 Long term (current) use of antibiotics: Secondary | ICD-10-CM | POA: Diagnosis not present

## 2022-09-23 DIAGNOSIS — Z931 Gastrostomy status: Secondary | ICD-10-CM | POA: Diagnosis not present

## 2022-09-23 DIAGNOSIS — Z9221 Personal history of antineoplastic chemotherapy: Secondary | ICD-10-CM | POA: Diagnosis not present

## 2022-09-23 DIAGNOSIS — C9200 Acute myeloblastic leukemia, not having achieved remission: Secondary | ICD-10-CM | POA: Diagnosis not present

## 2022-09-23 DIAGNOSIS — Z794 Long term (current) use of insulin: Secondary | ICD-10-CM | POA: Diagnosis not present

## 2022-09-23 DIAGNOSIS — I129 Hypertensive chronic kidney disease with stage 1 through stage 4 chronic kidney disease, or unspecified chronic kidney disease: Secondary | ICD-10-CM | POA: Diagnosis not present

## 2022-09-26 DIAGNOSIS — C9201 Acute myeloblastic leukemia, in remission: Secondary | ICD-10-CM | POA: Diagnosis not present

## 2022-09-26 DIAGNOSIS — Z9481 Bone marrow transplant status: Secondary | ICD-10-CM | POA: Diagnosis not present

## 2022-09-27 DIAGNOSIS — Z9221 Personal history of antineoplastic chemotherapy: Secondary | ICD-10-CM | POA: Diagnosis not present

## 2022-09-27 DIAGNOSIS — Z931 Gastrostomy status: Secondary | ICD-10-CM | POA: Diagnosis not present

## 2022-09-27 DIAGNOSIS — N1831 Chronic kidney disease, stage 3a: Secondary | ICD-10-CM | POA: Diagnosis not present

## 2022-09-27 DIAGNOSIS — I129 Hypertensive chronic kidney disease with stage 1 through stage 4 chronic kidney disease, or unspecified chronic kidney disease: Secondary | ICD-10-CM | POA: Diagnosis not present

## 2022-09-27 DIAGNOSIS — Z794 Long term (current) use of insulin: Secondary | ICD-10-CM | POA: Diagnosis not present

## 2022-09-27 DIAGNOSIS — Z792 Long term (current) use of antibiotics: Secondary | ICD-10-CM | POA: Diagnosis not present

## 2022-09-27 DIAGNOSIS — R1310 Dysphagia, unspecified: Secondary | ICD-10-CM | POA: Diagnosis not present

## 2022-09-27 DIAGNOSIS — Z79891 Long term (current) use of opiate analgesic: Secondary | ICD-10-CM | POA: Diagnosis not present

## 2022-09-27 DIAGNOSIS — C9200 Acute myeloblastic leukemia, not having achieved remission: Secondary | ICD-10-CM | POA: Diagnosis not present

## 2022-09-27 DIAGNOSIS — Z9484 Stem cells transplant status: Secondary | ICD-10-CM | POA: Diagnosis not present

## 2022-09-29 ENCOUNTER — Other Ambulatory Visit (HOSPITAL_COMMUNITY): Payer: Self-pay

## 2022-09-29 ENCOUNTER — Other Ambulatory Visit: Payer: Self-pay

## 2022-09-29 DIAGNOSIS — C9201 Acute myeloblastic leukemia, in remission: Secondary | ICD-10-CM | POA: Diagnosis not present

## 2022-09-29 DIAGNOSIS — Z9481 Bone marrow transplant status: Secondary | ICD-10-CM | POA: Diagnosis not present

## 2022-09-29 MED ORDER — PANTOPRAZOLE SODIUM 40 MG PO TBEC
40.0000 mg | DELAYED_RELEASE_TABLET | Freq: Every day | ORAL | 3 refills | Status: DC
  Filled 2022-09-29: qty 90, 90d supply, fill #0
  Filled 2022-12-26: qty 90, 90d supply, fill #1
  Filled 2023-03-29: qty 90, 90d supply, fill #2
  Filled 2023-06-27: qty 90, 90d supply, fill #3

## 2022-09-29 MED ORDER — LETERMOVIR 480 MG PO TABS
480.0000 mg | ORAL_TABLET | Freq: Every evening | ORAL | 5 refills | Status: AC
  Filled 2022-09-29 – 2022-10-20 (×2): qty 28, 28d supply, fill #0
  Filled 2022-11-15 – 2022-11-21 (×3): qty 28, 28d supply, fill #1
  Filled 2022-12-13: qty 28, 28d supply, fill #2
  Filled 2023-01-09 – 2023-01-20 (×2): qty 28, 28d supply, fill #3

## 2022-09-30 ENCOUNTER — Other Ambulatory Visit (HOSPITAL_COMMUNITY): Payer: Self-pay

## 2022-09-30 ENCOUNTER — Other Ambulatory Visit: Payer: Self-pay

## 2022-09-30 DIAGNOSIS — Z792 Long term (current) use of antibiotics: Secondary | ICD-10-CM | POA: Diagnosis not present

## 2022-09-30 DIAGNOSIS — C9200 Acute myeloblastic leukemia, not having achieved remission: Secondary | ICD-10-CM | POA: Diagnosis not present

## 2022-09-30 DIAGNOSIS — Z9484 Stem cells transplant status: Secondary | ICD-10-CM | POA: Diagnosis not present

## 2022-09-30 DIAGNOSIS — I129 Hypertensive chronic kidney disease with stage 1 through stage 4 chronic kidney disease, or unspecified chronic kidney disease: Secondary | ICD-10-CM | POA: Diagnosis not present

## 2022-09-30 DIAGNOSIS — Z931 Gastrostomy status: Secondary | ICD-10-CM | POA: Diagnosis not present

## 2022-09-30 DIAGNOSIS — Z79891 Long term (current) use of opiate analgesic: Secondary | ICD-10-CM | POA: Diagnosis not present

## 2022-09-30 DIAGNOSIS — N1831 Chronic kidney disease, stage 3a: Secondary | ICD-10-CM | POA: Diagnosis not present

## 2022-09-30 DIAGNOSIS — Z794 Long term (current) use of insulin: Secondary | ICD-10-CM | POA: Diagnosis not present

## 2022-09-30 DIAGNOSIS — Z9221 Personal history of antineoplastic chemotherapy: Secondary | ICD-10-CM | POA: Diagnosis not present

## 2022-10-01 DIAGNOSIS — Z8579 Personal history of other malignant neoplasms of lymphoid, hematopoietic and related tissues: Secondary | ICD-10-CM | POA: Diagnosis not present

## 2022-10-01 DIAGNOSIS — Z9484 Stem cells transplant status: Secondary | ICD-10-CM | POA: Diagnosis not present

## 2022-10-01 DIAGNOSIS — N3289 Other specified disorders of bladder: Secondary | ICD-10-CM | POA: Diagnosis not present

## 2022-10-01 DIAGNOSIS — R310 Gross hematuria: Secondary | ICD-10-CM | POA: Diagnosis not present

## 2022-10-03 DIAGNOSIS — Z9481 Bone marrow transplant status: Secondary | ICD-10-CM | POA: Diagnosis not present

## 2022-10-03 DIAGNOSIS — C9201 Acute myeloblastic leukemia, in remission: Secondary | ICD-10-CM | POA: Diagnosis not present

## 2022-10-03 DIAGNOSIS — Z006 Encounter for examination for normal comparison and control in clinical research program: Secondary | ICD-10-CM | POA: Diagnosis not present

## 2022-10-04 ENCOUNTER — Other Ambulatory Visit (HOSPITAL_COMMUNITY): Payer: Self-pay

## 2022-10-04 DIAGNOSIS — Z9484 Stem cells transplant status: Secondary | ICD-10-CM | POA: Diagnosis not present

## 2022-10-04 DIAGNOSIS — Z79891 Long term (current) use of opiate analgesic: Secondary | ICD-10-CM | POA: Diagnosis not present

## 2022-10-04 DIAGNOSIS — C9200 Acute myeloblastic leukemia, not having achieved remission: Secondary | ICD-10-CM | POA: Diagnosis not present

## 2022-10-04 DIAGNOSIS — Z792 Long term (current) use of antibiotics: Secondary | ICD-10-CM | POA: Diagnosis not present

## 2022-10-04 DIAGNOSIS — Z9221 Personal history of antineoplastic chemotherapy: Secondary | ICD-10-CM | POA: Diagnosis not present

## 2022-10-04 DIAGNOSIS — I129 Hypertensive chronic kidney disease with stage 1 through stage 4 chronic kidney disease, or unspecified chronic kidney disease: Secondary | ICD-10-CM | POA: Diagnosis not present

## 2022-10-04 DIAGNOSIS — N1831 Chronic kidney disease, stage 3a: Secondary | ICD-10-CM | POA: Diagnosis not present

## 2022-10-04 DIAGNOSIS — Z794 Long term (current) use of insulin: Secondary | ICD-10-CM | POA: Diagnosis not present

## 2022-10-04 DIAGNOSIS — Z931 Gastrostomy status: Secondary | ICD-10-CM | POA: Diagnosis not present

## 2022-10-04 MED ORDER — TACROLIMUS 0.5 MG PO CAPS
ORAL_CAPSULE | ORAL | 2 refills | Status: AC
  Filled 2022-10-04 – 2022-10-20 (×3): qty 90, 30d supply, fill #0

## 2022-10-04 MED ORDER — FLUCONAZOLE 200 MG PO TABS
400.0000 mg | ORAL_TABLET | Freq: Every day | ORAL | 0 refills | Status: AC
  Filled 2022-10-04 – 2022-10-20 (×2): qty 120, 60d supply, fill #0

## 2022-10-04 MED ORDER — FREESTYLE LANCETS MISC
Freq: Four times a day (QID) | 1 refills | Status: AC
  Filled 2022-10-04: qty 100, 25d supply, fill #0

## 2022-10-04 MED ORDER — ACYCLOVIR 800 MG PO TABS
800.0000 mg | ORAL_TABLET | Freq: Two times a day (BID) | ORAL | 3 refills | Status: DC
  Filled 2022-10-04 – 2022-10-20 (×2): qty 180, 90d supply, fill #0
  Filled 2023-01-23: qty 180, 90d supply, fill #1
  Filled 2023-04-27 (×2): qty 180, 90d supply, fill #2
  Filled 2023-07-24: qty 120, 60d supply, fill #3

## 2022-10-04 MED ORDER — AMOXICILLIN 500 MG PO CAPS: 2000.0000 mg | ORAL_CAPSULE | ORAL | 0 refills | Status: AC

## 2022-10-04 MED ORDER — FOLIC ACID 1 MG PO TABS
1.0000 mg | ORAL_TABLET | Freq: Every day | ORAL | 2 refills | Status: DC
  Filled 2022-10-04 – 2022-10-20 (×2): qty 30, 30d supply, fill #0
  Filled 2022-11-23 (×2): qty 30, 30d supply, fill #1

## 2022-10-04 MED ORDER — INSULIN GLARGINE-YFGN 100 UNIT/ML ~~LOC~~ SOPN
40.0000 [IU] | PEN_INJECTOR | Freq: Every day | SUBCUTANEOUS | 1 refills | Status: AC
  Filled 2022-10-31: qty 15, 37d supply, fill #0

## 2022-10-04 MED ORDER — INSULIN GLARGINE-YFGN 100 UNIT/ML ~~LOC~~ SOPN
40.0000 [IU] | PEN_INJECTOR | Freq: Every day | SUBCUTANEOUS | 1 refills | Status: AC
  Filled 2022-10-04: qty 12, 30d supply, fill #0

## 2022-10-04 MED ORDER — URSODIOL 300 MG PO CAPS
300.0000 mg | ORAL_CAPSULE | Freq: Three times a day (TID) | ORAL | 0 refills | Status: AC
  Filled 2022-10-04 – 2022-10-20 (×2): qty 36, 12d supply, fill #0

## 2022-10-04 MED ORDER — METOPROLOL TARTRATE 25 MG PO TABS
12.5000 mg | ORAL_TABLET | Freq: Two times a day (BID) | ORAL | 3 refills | Status: DC
  Filled 2022-10-04 – 2022-10-06 (×2): qty 60, 60d supply, fill #0
  Filled 2022-11-23 (×2): qty 60, 60d supply, fill #1
  Filled 2023-01-23: qty 60, 60d supply, fill #2
  Filled 2023-03-29: qty 30, 30d supply, fill #3

## 2022-10-04 MED ORDER — PENICILLIN V POTASSIUM 500 MG PO TABS
500.0000 mg | ORAL_TABLET | Freq: Two times a day (BID) | ORAL | 0 refills | Status: AC
  Filled 2022-10-04 – 2022-10-20 (×3): qty 120, 60d supply, fill #0
  Filled 2022-10-20: qty 60, 30d supply, fill #0

## 2022-10-04 MED ORDER — INSULIN LISPRO (1 UNIT DIAL) 100 UNIT/ML (KWIKPEN): PEN_INJECTOR | SUBCUTANEOUS | 1 refills | Status: AC

## 2022-10-05 ENCOUNTER — Other Ambulatory Visit: Payer: Self-pay

## 2022-10-05 ENCOUNTER — Other Ambulatory Visit (HOSPITAL_COMMUNITY): Payer: Self-pay

## 2022-10-05 MED ORDER — FREESTYLE LITE TEST VI STRP
1.0000 | ORAL_STRIP | 1 refills | Status: AC
  Filled 2022-10-05: qty 150, 38d supply, fill #0
  Filled 2022-10-10 – 2022-10-11 (×2): qty 200, 50d supply, fill #0

## 2022-10-05 MED ORDER — INSULIN PEN NEEDLE 32G X 4 MM MISC
1 refills | Status: AC
  Filled 2022-10-05: qty 200, 50d supply, fill #0

## 2022-10-05 MED ORDER — ALBUTEROL SULFATE HFA 108 (90 BASE) MCG/ACT IN AERS
2.0000 | INHALATION_SPRAY | RESPIRATORY_TRACT | 0 refills | Status: AC
  Filled 2022-10-05: qty 6.7, 20d supply, fill #0
  Filled 2022-10-05: qty 6.7, 30d supply, fill #0
  Filled 2022-10-11: qty 6.7, 25d supply, fill #0

## 2022-10-05 MED ORDER — MG PLUS PROTEIN 133 MG PO TABS
133.0000 mg | ORAL_TABLET | Freq: Three times a day (TID) | ORAL | 3 refills | Status: AC
  Filled 2022-10-05 – 2022-10-20 (×2): qty 100, 34d supply, fill #0
  Filled 2022-11-23: qty 100, 34d supply, fill #1
  Filled 2022-12-26: qty 60, 20d supply, fill #2

## 2022-10-06 ENCOUNTER — Other Ambulatory Visit (HOSPITAL_COMMUNITY): Payer: Self-pay

## 2022-10-06 ENCOUNTER — Other Ambulatory Visit: Payer: Self-pay

## 2022-10-06 DIAGNOSIS — C9201 Acute myeloblastic leukemia, in remission: Secondary | ICD-10-CM | POA: Diagnosis not present

## 2022-10-06 DIAGNOSIS — Z9481 Bone marrow transplant status: Secondary | ICD-10-CM | POA: Diagnosis not present

## 2022-10-07 ENCOUNTER — Other Ambulatory Visit: Payer: Self-pay

## 2022-10-07 DIAGNOSIS — Z792 Long term (current) use of antibiotics: Secondary | ICD-10-CM | POA: Diagnosis not present

## 2022-10-07 DIAGNOSIS — Z794 Long term (current) use of insulin: Secondary | ICD-10-CM | POA: Diagnosis not present

## 2022-10-07 DIAGNOSIS — N1831 Chronic kidney disease, stage 3a: Secondary | ICD-10-CM | POA: Diagnosis not present

## 2022-10-07 DIAGNOSIS — Z9221 Personal history of antineoplastic chemotherapy: Secondary | ICD-10-CM | POA: Diagnosis not present

## 2022-10-07 DIAGNOSIS — I129 Hypertensive chronic kidney disease with stage 1 through stage 4 chronic kidney disease, or unspecified chronic kidney disease: Secondary | ICD-10-CM | POA: Diagnosis not present

## 2022-10-07 DIAGNOSIS — Z9484 Stem cells transplant status: Secondary | ICD-10-CM | POA: Diagnosis not present

## 2022-10-07 DIAGNOSIS — Z931 Gastrostomy status: Secondary | ICD-10-CM | POA: Diagnosis not present

## 2022-10-07 DIAGNOSIS — C9200 Acute myeloblastic leukemia, not having achieved remission: Secondary | ICD-10-CM | POA: Diagnosis not present

## 2022-10-07 DIAGNOSIS — Z79891 Long term (current) use of opiate analgesic: Secondary | ICD-10-CM | POA: Diagnosis not present

## 2022-10-08 ENCOUNTER — Encounter (HOSPITAL_COMMUNITY): Payer: Self-pay

## 2022-10-10 ENCOUNTER — Other Ambulatory Visit (HOSPITAL_COMMUNITY): Payer: Self-pay

## 2022-10-10 DIAGNOSIS — Z9481 Bone marrow transplant status: Secondary | ICD-10-CM | POA: Diagnosis not present

## 2022-10-10 DIAGNOSIS — C9201 Acute myeloblastic leukemia, in remission: Secondary | ICD-10-CM | POA: Diagnosis not present

## 2022-10-10 MED ORDER — QUETIAPINE FUMARATE 25 MG PO TABS
12.5000 mg | ORAL_TABLET | Freq: Every day | ORAL | 0 refills | Status: DC
  Filled 2022-10-10 – 2022-10-15 (×3): qty 15, 30d supply, fill #0

## 2022-10-10 MED ORDER — ONDANSETRON HCL 8 MG PO TABS
8.0000 mg | ORAL_TABLET | Freq: Every day | ORAL | 0 refills | Status: AC | PRN
  Filled 2022-10-10 – 2022-10-15 (×3): qty 30, 30d supply, fill #0

## 2022-10-11 ENCOUNTER — Other Ambulatory Visit (HOSPITAL_COMMUNITY): Payer: Self-pay

## 2022-10-11 ENCOUNTER — Other Ambulatory Visit: Payer: Self-pay

## 2022-10-11 DIAGNOSIS — I129 Hypertensive chronic kidney disease with stage 1 through stage 4 chronic kidney disease, or unspecified chronic kidney disease: Secondary | ICD-10-CM | POA: Diagnosis not present

## 2022-10-11 DIAGNOSIS — Z9221 Personal history of antineoplastic chemotherapy: Secondary | ICD-10-CM | POA: Diagnosis not present

## 2022-10-11 DIAGNOSIS — Z792 Long term (current) use of antibiotics: Secondary | ICD-10-CM | POA: Diagnosis not present

## 2022-10-11 DIAGNOSIS — N1831 Chronic kidney disease, stage 3a: Secondary | ICD-10-CM | POA: Diagnosis not present

## 2022-10-11 DIAGNOSIS — Z931 Gastrostomy status: Secondary | ICD-10-CM | POA: Diagnosis not present

## 2022-10-11 DIAGNOSIS — Z794 Long term (current) use of insulin: Secondary | ICD-10-CM | POA: Diagnosis not present

## 2022-10-11 DIAGNOSIS — C9200 Acute myeloblastic leukemia, not having achieved remission: Secondary | ICD-10-CM | POA: Diagnosis not present

## 2022-10-11 DIAGNOSIS — Z79891 Long term (current) use of opiate analgesic: Secondary | ICD-10-CM | POA: Diagnosis not present

## 2022-10-11 DIAGNOSIS — Z9484 Stem cells transplant status: Secondary | ICD-10-CM | POA: Diagnosis not present

## 2022-10-14 ENCOUNTER — Other Ambulatory Visit (HOSPITAL_COMMUNITY): Payer: Self-pay

## 2022-10-15 ENCOUNTER — Other Ambulatory Visit (HOSPITAL_COMMUNITY): Payer: Self-pay

## 2022-10-17 DIAGNOSIS — Z9481 Bone marrow transplant status: Secondary | ICD-10-CM | POA: Diagnosis not present

## 2022-10-17 DIAGNOSIS — C9201 Acute myeloblastic leukemia, in remission: Secondary | ICD-10-CM | POA: Diagnosis not present

## 2022-10-19 ENCOUNTER — Other Ambulatory Visit (HOSPITAL_COMMUNITY): Payer: Self-pay

## 2022-10-20 ENCOUNTER — Other Ambulatory Visit (HOSPITAL_COMMUNITY): Payer: Self-pay

## 2022-10-20 ENCOUNTER — Other Ambulatory Visit: Payer: Self-pay

## 2022-10-20 DIAGNOSIS — N3091 Cystitis, unspecified with hematuria: Secondary | ICD-10-CM | POA: Diagnosis not present

## 2022-10-20 DIAGNOSIS — Z9481 Bone marrow transplant status: Secondary | ICD-10-CM | POA: Diagnosis not present

## 2022-10-20 DIAGNOSIS — C9201 Acute myeloblastic leukemia, in remission: Secondary | ICD-10-CM | POA: Diagnosis not present

## 2022-10-20 MED ORDER — DOXAZOSIN MESYLATE 4 MG PO TABS
4.0000 mg | ORAL_TABLET | Freq: Every day | ORAL | 3 refills | Status: DC
  Filled 2022-10-20: qty 30, 30d supply, fill #0
  Filled 2022-11-15: qty 30, 30d supply, fill #1
  Filled 2022-12-13: qty 30, 30d supply, fill #2
  Filled 2022-12-30 – 2023-01-09 (×2): qty 30, 30d supply, fill #3

## 2022-10-21 ENCOUNTER — Other Ambulatory Visit: Payer: Self-pay

## 2022-10-21 ENCOUNTER — Other Ambulatory Visit (HOSPITAL_COMMUNITY): Payer: Self-pay

## 2022-10-21 DIAGNOSIS — C9201 Acute myeloblastic leukemia, in remission: Secondary | ICD-10-CM | POA: Diagnosis not present

## 2022-10-21 DIAGNOSIS — Z9484 Stem cells transplant status: Secondary | ICD-10-CM | POA: Diagnosis not present

## 2022-10-21 DIAGNOSIS — C9200 Acute myeloblastic leukemia, not having achieved remission: Secondary | ICD-10-CM | POA: Diagnosis not present

## 2022-10-21 DIAGNOSIS — Z9481 Bone marrow transplant status: Secondary | ICD-10-CM | POA: Diagnosis not present

## 2022-10-21 DIAGNOSIS — N179 Acute kidney failure, unspecified: Secondary | ICD-10-CM | POA: Diagnosis not present

## 2022-10-24 DIAGNOSIS — N179 Acute kidney failure, unspecified: Secondary | ICD-10-CM | POA: Diagnosis not present

## 2022-10-24 DIAGNOSIS — Z9484 Stem cells transplant status: Secondary | ICD-10-CM | POA: Diagnosis not present

## 2022-10-24 DIAGNOSIS — Z9481 Bone marrow transplant status: Secondary | ICD-10-CM | POA: Diagnosis not present

## 2022-10-24 DIAGNOSIS — C9201 Acute myeloblastic leukemia, in remission: Secondary | ICD-10-CM | POA: Diagnosis not present

## 2022-10-24 DIAGNOSIS — C9200 Acute myeloblastic leukemia, not having achieved remission: Secondary | ICD-10-CM | POA: Diagnosis not present

## 2022-10-31 ENCOUNTER — Other Ambulatory Visit (HOSPITAL_COMMUNITY): Payer: Self-pay

## 2022-10-31 DIAGNOSIS — N179 Acute kidney failure, unspecified: Secondary | ICD-10-CM | POA: Diagnosis not present

## 2022-10-31 DIAGNOSIS — Z9484 Stem cells transplant status: Secondary | ICD-10-CM | POA: Diagnosis not present

## 2022-10-31 DIAGNOSIS — C9201 Acute myeloblastic leukemia, in remission: Secondary | ICD-10-CM | POA: Diagnosis not present

## 2022-10-31 DIAGNOSIS — C9200 Acute myeloblastic leukemia, not having achieved remission: Secondary | ICD-10-CM | POA: Diagnosis not present

## 2022-10-31 DIAGNOSIS — Z9481 Bone marrow transplant status: Secondary | ICD-10-CM | POA: Diagnosis not present

## 2022-11-01 ENCOUNTER — Other Ambulatory Visit (HOSPITAL_COMMUNITY): Payer: Self-pay

## 2022-11-01 DIAGNOSIS — N3001 Acute cystitis with hematuria: Secondary | ICD-10-CM | POA: Diagnosis not present

## 2022-11-01 MED ORDER — TACROLIMUS 0.5 MG PO CAPS
0.5000 mg | ORAL_CAPSULE | Freq: Two times a day (BID) | ORAL | 2 refills | Status: AC
  Filled 2022-11-01: qty 60, 30d supply, fill #0

## 2022-11-03 DIAGNOSIS — C9201 Acute myeloblastic leukemia, in remission: Secondary | ICD-10-CM | POA: Diagnosis not present

## 2022-11-03 DIAGNOSIS — N179 Acute kidney failure, unspecified: Secondary | ICD-10-CM | POA: Diagnosis not present

## 2022-11-03 DIAGNOSIS — Z9484 Stem cells transplant status: Secondary | ICD-10-CM | POA: Diagnosis not present

## 2022-11-03 DIAGNOSIS — C9200 Acute myeloblastic leukemia, not having achieved remission: Secondary | ICD-10-CM | POA: Diagnosis not present

## 2022-11-03 DIAGNOSIS — Z9481 Bone marrow transplant status: Secondary | ICD-10-CM | POA: Diagnosis not present

## 2022-11-07 DIAGNOSIS — C9201 Acute myeloblastic leukemia, in remission: Secondary | ICD-10-CM | POA: Diagnosis not present

## 2022-11-07 DIAGNOSIS — Z9481 Bone marrow transplant status: Secondary | ICD-10-CM | POA: Diagnosis not present

## 2022-11-07 DIAGNOSIS — C9200 Acute myeloblastic leukemia, not having achieved remission: Secondary | ICD-10-CM | POA: Diagnosis not present

## 2022-11-14 ENCOUNTER — Other Ambulatory Visit (HOSPITAL_COMMUNITY): Payer: Self-pay

## 2022-11-14 DIAGNOSIS — Z9481 Bone marrow transplant status: Secondary | ICD-10-CM | POA: Diagnosis not present

## 2022-11-14 DIAGNOSIS — C9201 Acute myeloblastic leukemia, in remission: Secondary | ICD-10-CM | POA: Diagnosis not present

## 2022-11-14 DIAGNOSIS — C9200 Acute myeloblastic leukemia, not having achieved remission: Secondary | ICD-10-CM | POA: Diagnosis not present

## 2022-11-14 DIAGNOSIS — Z9484 Stem cells transplant status: Secondary | ICD-10-CM | POA: Diagnosis not present

## 2022-11-14 MED ORDER — ELIQUIS 5 MG PO TABS
5.0000 mg | ORAL_TABLET | Freq: Two times a day (BID) | ORAL | 0 refills | Status: AC
  Filled 2022-11-14: qty 28, 14d supply, fill #0

## 2022-11-15 ENCOUNTER — Other Ambulatory Visit (HOSPITAL_COMMUNITY): Payer: Self-pay

## 2022-11-21 ENCOUNTER — Other Ambulatory Visit (HOSPITAL_COMMUNITY): Payer: Self-pay

## 2022-11-21 DIAGNOSIS — C9201 Acute myeloblastic leukemia, in remission: Secondary | ICD-10-CM | POA: Diagnosis not present

## 2022-11-21 DIAGNOSIS — C9200 Acute myeloblastic leukemia, not having achieved remission: Secondary | ICD-10-CM | POA: Diagnosis not present

## 2022-11-21 DIAGNOSIS — Z9484 Stem cells transplant status: Secondary | ICD-10-CM | POA: Diagnosis not present

## 2022-11-21 DIAGNOSIS — Z9481 Bone marrow transplant status: Secondary | ICD-10-CM | POA: Diagnosis not present

## 2022-11-22 ENCOUNTER — Other Ambulatory Visit (HOSPITAL_COMMUNITY): Payer: Self-pay

## 2022-11-22 ENCOUNTER — Other Ambulatory Visit: Payer: Self-pay

## 2022-11-22 MED ORDER — AMOXICILLIN 500 MG PO CAPS
2000.0000 mg | ORAL_CAPSULE | Freq: Once | ORAL | 0 refills | Status: AC
  Filled 2022-11-22: qty 4, 1d supply, fill #0

## 2022-11-23 ENCOUNTER — Other Ambulatory Visit (HOSPITAL_COMMUNITY): Payer: Self-pay

## 2022-11-23 ENCOUNTER — Other Ambulatory Visit: Payer: Self-pay

## 2022-11-23 MED ORDER — QUETIAPINE FUMARATE 25 MG PO TABS
12.5000 mg | ORAL_TABLET | Freq: Every day | ORAL | 3 refills | Status: AC
  Filled 2022-11-23: qty 15, 30d supply, fill #0

## 2022-11-24 ENCOUNTER — Other Ambulatory Visit (HOSPITAL_COMMUNITY): Payer: Self-pay

## 2022-11-28 ENCOUNTER — Other Ambulatory Visit: Payer: Self-pay

## 2022-11-28 ENCOUNTER — Other Ambulatory Visit (HOSPITAL_COMMUNITY): Payer: Self-pay

## 2022-11-28 DIAGNOSIS — C9200 Acute myeloblastic leukemia, not having achieved remission: Secondary | ICD-10-CM | POA: Diagnosis not present

## 2022-11-28 DIAGNOSIS — Z9484 Stem cells transplant status: Secondary | ICD-10-CM | POA: Diagnosis not present

## 2022-11-28 DIAGNOSIS — C9201 Acute myeloblastic leukemia, in remission: Secondary | ICD-10-CM | POA: Diagnosis not present

## 2022-11-28 DIAGNOSIS — Z9481 Bone marrow transplant status: Secondary | ICD-10-CM | POA: Diagnosis not present

## 2022-12-05 DIAGNOSIS — Z9484 Stem cells transplant status: Secondary | ICD-10-CM | POA: Diagnosis not present

## 2022-12-05 DIAGNOSIS — Z9481 Bone marrow transplant status: Secondary | ICD-10-CM | POA: Diagnosis not present

## 2022-12-05 DIAGNOSIS — C9201 Acute myeloblastic leukemia, in remission: Secondary | ICD-10-CM | POA: Diagnosis not present

## 2022-12-05 DIAGNOSIS — C9200 Acute myeloblastic leukemia, not having achieved remission: Secondary | ICD-10-CM | POA: Diagnosis not present

## 2022-12-09 ENCOUNTER — Ambulatory Visit: Payer: Commercial Managed Care - PPO | Attending: Gerontology | Admitting: Physical Therapy

## 2022-12-09 NOTE — Therapy (Deleted)
OUTPATIENT PHYSICAL THERAPY THORACOLUMBAR EVALUATION   Patient Name: Marc Nielsen MRN: [id number] DOB:December 11, 1970, 52 y.o., male Today's Date: 12/09/2022  END OF SESSION:   Past Medical History:  Diagnosis Date   History of anal fissures 03/08/2022   History of H. 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 Patient Active Problem List   Diagnosis Date Noted   Anemia 03/09/2022   Macrocytic anemia 03/08/2022   History of H. pylori infection 03/08/2022   AKI (acute kidney injury) (HCC) 03/08/2022   Subacromial bursitis of left shoulder joint 03/08/2022   Tear of right supraspinatus tendon 03/08/2022   History of anal fissures 03/08/2022    PCP: ***  REFERRING PROVIDER: ***  REFERRING DIAG: ***  Rationale for Evaluation and Treatment: {HABREHAB:27488}  THERAPY DIAG:  No diagnosis found.  ONSET DATE: ***  SUBJECTIVE:                                                                                                                                                                                           SUBJECTIVE STATEMENT: ***  PERTINENT HISTORY:  ***  PAIN:  Are you having pain? {OPRCPAIN:27236}  PRECAUTIONS: {Therapy precautions:24002}  RED FLAGS: {PT Red Flags:29287}   WEIGHT BEARING RESTRICTIONS: {Yes ***/No:24003}  FALLS:  Has patient fallen in last 6 months? {fallsyesno:27318}  LIVING ENVIRONMENT: Lives with: {[facility] lives with:25569::"lives with their family"} Lives in: {Lives in:25570} Stairs: {opstairs:27293} Has following equipment at home: {Assistive devices:23999}  OCCUPATION: ***  PLOF: {PLOF:24004}  PATIENT GOALS: ***  NEXT MD VISIT: ***  OBJECTIVE:  Note: Objective measures were completed at Evaluation unless otherwise noted.  DIAGNOSTIC FINDINGS:  ***  PATIENT SURVEYS:   {rehab surveys:24030}  SCREENING FOR RED FLAGS: Bowel or bladder incontinence: {Yes/No:304960894} Spinal tumors: {Yes/No:304960894} Cauda equina syndrome: {Yes/No:304960894} Compression fracture: {Yes/No:304960894} Abdominal aneurysm: {Yes/No:304960894}  COGNITION: Overall cognitive status: {cognition:24006}     SENSATION: {sensation:27233}  MUSCLE LENGTH: Hamstrings: Right *** deg; Left *** deg Thomas test: Right *** deg; Left *** deg  POSTURE: {posture:25561}  PALPATION: ***  LUMBAR ROM:   AROM eval  Flexion   Extension   Right lateral flexion   Left lateral flexion   Right rotation   Left rotation    (Blank rows = not tested)  LOWER EXTREMITY ROM:     {AROM/PROM:27142}  Right eval Left eval  Hip flexion    Hip extension    Hip abduction    Hip adduction    Hip internal rotation  Hip external rotation    Knee flexion    Knee extension    Ankle dorsiflexion    Ankle plantarflexion    Ankle inversion    Ankle eversion     (Blank rows = not tested)  LOWER EXTREMITY MMT:    MMT Right eval Left eval  Hip flexion    Hip extension    Hip abduction    Hip adduction    Hip internal rotation    Hip external rotation    Knee flexion    Knee extension    Ankle dorsiflexion    Ankle plantarflexion    Ankle inversion    Ankle eversion     (Blank rows = not tested)  LUMBAR SPECIAL TESTS:  {lumbar special test:25242}  FUNCTIONAL TESTS:  {Functional tests:24029}  GAIT: Distance walked: *** Assistive device utilized: {Assistive devices:23999} Level of assistance: {Levels of assistance:24026} Comments: ***  TODAY'S TREATMENT:                                                                                                                              DATE: ***    PATIENT EDUCATION:  Education details: *** Person educated: {Person educated:25204} Education method: {Education Method:25205} Education comprehension: {Education  Comprehension:25206}  HOME EXERCISE PROGRAM: ***  ASSESSMENT:  CLINICAL IMPRESSION: Patient is a *** y.o. *** who was seen today for physical therapy evaluation and treatment for ***.   OBJECTIVE IMPAIRMENTS: {opptimpairments:25111}.   ACTIVITY LIMITATIONS: {activitylimitations:27494}  PARTICIPATION LIMITATIONS: {participationrestrictions:25113}  PERSONAL FACTORS: {Personal factors:25162} are also affecting patient's functional outcome.   REHAB POTENTIAL: {rehabpotential:25112}  CLINICAL DECISION MAKING: {clinical decision making:25114}  EVALUATION COMPLEXITY: {Evaluation complexity:25115}   GOALS: Goals reviewed with patient? {yes/no:20286}  SHORT TERM GOALS: Target date: ***  *** Baseline: Goal status: INITIAL  2.  *** Baseline:  Goal status: INITIAL  3.  *** Baseline:  Goal status: INITIAL  4.  *** Baseline:  Goal status: INITIAL  5.  *** Baseline:  Goal status: INITIAL  6.  *** Baseline:  Goal status: INITIAL  LONG TERM GOALS: Target date: ***  *** Baseline:  Goal status: INITIAL  2.  *** Baseline:  Goal status: INITIAL  3.  *** Baseline:  Goal status: INITIAL  4.  *** Baseline:  Goal status: INITIAL  5.  *** Baseline:  Goal status: INITIAL  6.  *** Baseline:  Goal status: INITIAL  PLAN:  PT FREQUENCY: {rehab frequency:25116}  PT DURATION: {rehab duration:25117}  PLANNED INTERVENTIONS: {rehab planned interventions:25118::"97110-Therapeutic exercises","97530- Therapeutic [phone]- Neuromuscular re-education","97535- Self JXBJ","47829- Manual therapy"}.  PLAN FOR NEXT SESSION: ***   Yakira Duquette, PT 12/09/2022, 8:02 AM

## 2022-12-09 NOTE — Therapy (Deleted)
OUTPATIENT PHYSICAL THERAPY SHOULDER EVALUATION   Patient Name: Marc Nielsen MRN: [id number] DOB:June 27, 1970, 52 y.o., male Today's Date: 12/09/2022  END OF SESSION:   Past Medical History:  Diagnosis Date   History of anal fissures 03/08/2022   History of H. 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 Patient Active Problem List   Diagnosis Date Noted   Anemia 03/09/2022   Macrocytic anemia 03/08/2022   History of H. pylori infection 03/08/2022   AKI (acute kidney injury) (HCC) 03/08/2022   Subacromial bursitis of left shoulder joint 03/08/2022   Tear of right supraspinatus tendon 03/08/2022   History of anal fissures 03/08/2022    PCP: Rometta Emery MD   REFERRING PROVIDER: Laurie Panda  REFERRING DIAG: R53.81 (ICD-10-CM) - Other malaise  THERAPY DIAG:  No diagnosis found.  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 Yes: NPRS scale: ***/10 Pain location: *** Pain description: *** Aggravating factors: *** Relieving factors: ***  PRECAUTIONS: Other: cancer   RED FLAGS: {PT Red Flags:29287}   WEIGHT BEARING RESTRICTIONS: No  FALLS:  Has patient fallen in last 6 months? ***  LIVING ENVIRONMENT: Lives with: lives with their family and lives with their spouse Lives in: House/apartment Stairs: {opstairs:27293} Has following equipment at home: {Assistive  devices:23999}  OCCUPATION: [employer]   PLOF: Independent with basic ADLs, Vocation/Vocational requirements: ***, and Leisure: ***  PATIENT GOALS:I want ot   NEXT MD VISIT:   OBJECTIVE:  Note: Objective measures were completed at Evaluation unless otherwise noted.  DIAGNOSTIC FINDINGS:  ***  PATIENT SURVEYS:  FOTO ***  COGNITION: Overall cognitive status: Within functional limits for tasks assessed     SENSATION: WFL  POSTURE: ***  UPPER EXTREMITY ROM:   {AROM/PROM:27142} ROM Right eval Left eval  Shoulder flexion    Shoulder extension    Shoulder abduction    Shoulder adduction    Shoulder internal rotation    Shoulder external rotation    Elbow flexion    Elbow extension    Wrist flexion    Wrist extension  Wrist ulnar deviation    Wrist radial deviation    Wrist pronation    Wrist supination    (Blank rows = not tested)  UPPER EXTREMITY MMT:  MMT Right eval Left eval  Shoulder flexion    Shoulder extension    Shoulder abduction    Shoulder adduction    Shoulder internal rotation    Shoulder external rotation    Middle trapezius    Lower trapezius    Elbow flexion    Elbow extension    Wrist flexion    Wrist extension    Wrist ulnar deviation    Wrist radial deviation    Wrist pronation    Wrist supination    Grip strength (lbs)    (Blank rows = not tested)  SHOULDER SPECIAL TESTS: Impingement tests: {shoulder impingement test:25231:a} SLAP lesions: {SLAP lesions:25232} Instability tests: {shoulder instability test:25233} Rotator cuff assessment: {rotator cuff assessment:25234} Biceps assessment: {biceps assessment:25235}  JOINT MOBILITY TESTING:  ***  PALPATION:  ***   TODAY'S TREATMENT:                                                                                                                                         DATE: 12/09/22   PATIENT EDUCATION: Education details: *** Person educated: {Person  educated:25204} Education method: {Education Method:25205} Education comprehension: {Education Comprehension:25206}  HOME EXERCISE PROGRAM: ***  ASSESSMENT:  CLINICAL IMPRESSION: Patient is a 52 y.o. male who was seen today for physical therapy evaluation and treatment for fatigue in the setting of .   OBJECTIVE IMPAIRMENTS: {opptimpairments:25111}.   ACTIVITY LIMITATIONS: {activitylimitations:27494}  PARTICIPATION LIMITATIONS: {participationrestrictions:25113}  PERSONAL FACTORS: {Personal factors:25162} are also affecting patient's functional outcome.   REHAB POTENTIAL: {rehabpotential:25112}  CLINICAL DECISION MAKING: {clinical decision making:25114}  EVALUATION COMPLEXITY: {Evaluation complexity:25115}   GOALS: Goals reviewed with patient? {yes/no:20286}  SHORT TERM GOALS: Target date: ***  *** Baseline: Goal status: INITIAL  2.  *** Baseline:  Goal status: INITIAL  3.  *** Baseline:  Goal status: INITIAL  4.  *** Baseline:  Goal status: INITIAL  5.  *** Baseline:  Goal status: INITIAL  6.  *** Baseline:  Goal status: INITIAL  LONG TERM GOALS: Target date: ***  *** Baseline:  Goal status: INITIAL  2.  *** Baseline:  Goal status: INITIAL  3.  *** Baseline:  Goal status: INITIAL  4.  *** Baseline:  Goal status: INITIAL  5.  *** Baseline:  Goal status: INITIAL  6.  *** Baseline:  Goal status: INITIAL  PLAN:  PT FREQUENCY: {rehab frequency:25116}  PT DURATION: {rehab duration:25117}  PLANNED INTERVENTIONS: {rehab planned interventions:25118::"97110-Therapeutic exercises","97530- Therapeutic [phone]- Neuromuscular re-education","97535- Self JXBJ","47829- Manual therapy"}  PLAN FOR NEXT SESSION: ***   Zacory Fiola, PT 12/09/2022, 9:19 AM

## 2022-12-12 DIAGNOSIS — Z9481 Bone marrow transplant status: Secondary | ICD-10-CM | POA: Diagnosis not present

## 2022-12-12 DIAGNOSIS — Z9484 Stem cells transplant status: Secondary | ICD-10-CM | POA: Diagnosis not present

## 2022-12-12 DIAGNOSIS — C9200 Acute myeloblastic leukemia, not having achieved remission: Secondary | ICD-10-CM | POA: Diagnosis not present

## 2022-12-12 DIAGNOSIS — C9201 Acute myeloblastic leukemia, in remission: Secondary | ICD-10-CM | POA: Diagnosis not present

## 2022-12-13 ENCOUNTER — Other Ambulatory Visit: Payer: Self-pay

## 2022-12-13 ENCOUNTER — Other Ambulatory Visit (HOSPITAL_COMMUNITY): Payer: Self-pay

## 2022-12-14 ENCOUNTER — Other Ambulatory Visit (HOSPITAL_COMMUNITY): Payer: Self-pay

## 2022-12-16 ENCOUNTER — Other Ambulatory Visit (HOSPITAL_COMMUNITY): Payer: Self-pay

## 2022-12-16 ENCOUNTER — Other Ambulatory Visit: Payer: Self-pay

## 2022-12-19 ENCOUNTER — Other Ambulatory Visit (HOSPITAL_COMMUNITY): Payer: Self-pay

## 2022-12-19 DIAGNOSIS — C9200 Acute myeloblastic leukemia, not having achieved remission: Secondary | ICD-10-CM | POA: Diagnosis not present

## 2022-12-19 DIAGNOSIS — C9201 Acute myeloblastic leukemia, in remission: Secondary | ICD-10-CM | POA: Diagnosis not present

## 2022-12-19 DIAGNOSIS — Z9481 Bone marrow transplant status: Secondary | ICD-10-CM | POA: Diagnosis not present

## 2022-12-19 DIAGNOSIS — Z9484 Stem cells transplant status: Secondary | ICD-10-CM | POA: Diagnosis not present

## 2022-12-20 ENCOUNTER — Other Ambulatory Visit (HOSPITAL_COMMUNITY): Payer: Self-pay

## 2022-12-21 ENCOUNTER — Other Ambulatory Visit (HOSPITAL_COMMUNITY): Payer: Self-pay

## 2022-12-21 ENCOUNTER — Encounter: Payer: Self-pay | Admitting: Physical Therapy

## 2022-12-21 ENCOUNTER — Ambulatory Visit: Payer: Commercial Managed Care - PPO

## 2022-12-21 ENCOUNTER — Ambulatory Visit: Payer: Commercial Managed Care - PPO | Attending: Gerontology | Admitting: Physical Therapy

## 2022-12-21 DIAGNOSIS — M6281 Muscle weakness (generalized): Secondary | ICD-10-CM | POA: Diagnosis not present

## 2022-12-21 DIAGNOSIS — R2689 Other abnormalities of gait and mobility: Secondary | ICD-10-CM | POA: Insufficient documentation

## 2022-12-21 NOTE — Therapy (Signed)
OUTPATIENT PHYSICAL THERAPY EVALUATION   Patient Name: Marc Nielsen MRN: [id number] DOB:1970-04-26, 52 y.o., male Today's Date: 12/21/2022  END OF SESSION:  PT End of Session - 12/21/22 1443     Visit Number 1    Number of Visits 17    Date for PT Re-Evaluation 02/15/23    Authorization Type [employer]    PT Start Time 1445    PT Stop Time 1528    PT Time Calculation (min) 43 min    Activity Tolerance Patient tolerated treatment well    Behavior During Therapy [facility] for tasks assessed/performed             Past Medical History:  Diagnosis Date   History of anal fissures 03/08/2022   History of H. 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 No pertinent surgical history. Patient Active Problem List   Diagnosis Date Noted   Anemia 03/09/2022   Macrocytic anemia 03/08/2022   History of H. pylori infection 03/08/2022   AKI (acute kidney injury) (HCC) 03/08/2022   Subacromial bursitis of left shoulder joint 03/08/2022   Tear of right supraspinatus tendon 03/08/2022   History of anal fissures 03/08/2022    PCP: Rometta Emery, MD  REFERRING PROVIDER: Laurie Panda, NP  REFERRING DIAG: R53.81 (ICD-10-CM) - Other malaise  Rationale for Evaluation and Treatment: Rehabilitation  THERAPY DIAG:  Muscle weakness (generalized)  Other abnormalities of gait and mobility  ONSET DATE: ~4 months ago, a couple years ago for shoulders  SUBJECTIVE:                                                                                                                                                                                           SUBJECTIVE STATEMENT: Pt states they want to work on shoulders and overall strength. 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 Has had shoulder issues for a couple years, was  getting injections but hasn't had any since cancer diagnosis in February. States he had reduction in activity with his hospitalization/procedures and wants to get back to work by February next year if able. Had acute PT in the hospital, discharged home with HHPT, finished up with them after a couple weeks. No N/T. No fevers/chills, no night sweats. States he follows with oncology weekly.   Accompanied by his son w/ pt consent.   PERTINENT HISTORY:  AML February 2024; deconditioning after stem cell transplant per paper referral  PAIN:  Are you having pain:  1/10 BIL shoulder Location/description: BIL shoulders, deep, stabbing while reaching  Best-worst over past week: 1-7/10  - aggravating factors: reaching, lifting, sleeping - Easing factors: support, injections    PRECAUTIONS: cancer, immunocompromised, R UE PICC line    WEIGHT BEARING RESTRICTIONS: No  FALLS:  Has patient fallen in last 6 months? No  LIVING ENVIRONMENT: 2 story house, 5-6 steps to bed/bath with rail 3-4STE Lives w/ wife who does majority of housework, has help with yardwork Does not require AD, has 4WW without seat. 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  DIAGNOSTIC FINDINGS:  Bone marrow ~4 months prior to eval per pt report  PATIENT SURVEYS:  FOTO deferred on eval given time constraints  COGNITION: Overall cognitive status: Within functional limits for tasks assessed     SENSATION: No sensory complaints    RANGE OF MOTION:     Active  Right eval Left eval  Shoulder flexion ~90 deg, deferred further given PICC 142deg mild pain  Functional ER combo  C5   Functional IR combo    Knee extension    Ankle dorsiflexion     (Blank rows = not tested) (Key: WFL = within functional limits not formally assessed, * = concordant pain, s = stiffness/stretching sensation, NT = not tested)  Comments:    STRENGTH TESTING:  MMT Right eval Left eval  Shoulder flexion  4-  Shoulder abduction  4- *  Shoulder ER  4- *  Shoulder IR  5  Elbow flexion  5  Elbow extension  5  Grip strength (gross)    Hip flexion 4- (mild transient hip pain) 4  Hip abduction (modified sitting) 4 4  Knee flexion 4 4  Knee extension 4 4  Ankle dorsiflexion 4+ 4+  Ankle plantarflexion     (Blank rows = not tested) (Key: WFL = within functional limits not formally assessed, * = concordant pain, s = stiffness/stretching sensation, NT = not tested)  Comments:     FUNCTIONAL TESTS:  5xSTS: 10.08sec minimal UE support TUG: 9.52 no AD :  435 ft no AD   GAIT: Distance walked: within clinic Assistive device utilized: None Level of assistance: Complete Independence Comments: reduced gait speed/cadence, reduced truncal ROM   TODAY'S TREATMENT:                                                                                                                              [facility] Adult PT Treatment:  DATE: 12/22/22 Therapeutic Exercise: STS x5 Marches standing at counter w/ UE support x5 HEP handout + education    PATIENT EDUCATION:  Education details: Pt education on PT impairments, prognosis, and POC. Informed consent. Rationale for interventions, safe/appropriate HEP performance Person educated: Patient and son Education method: Explanation, Demonstration, Tactile cues, Verbal cues, and Handouts Education comprehension: verbalized understanding, returned demonstration, verbal cues required, tactile cues required, and needs further education    HOME EXERCISE PROGRAM: Access Code: [email or web address] URL:  [email or web address] Date: 12/21/2022 Prepared by: Fransisco Hertz  Exercises - Sit to Stand  - 3-4 x daily - 1 sets - 5 reps - Standing March with Counter Support  - 3-4 x daily - 1 sets - 5 reps  ASSESSMENT:  CLINICAL IMPRESSION: Patient is a pleasant 52 y.o. gentleman who was seen today for physical therapy evaluation and treatment for fatigue/malaise in setting of deconditioning s/p stem cell transplant. Pt endorses shoulder pain over past couple years and ongoing weakness since stem cell transplant and prolonged hospitalization. 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 On exam today pt demonstrates reduction LE strength, although 5xSTS and TUG are grossly WNL and not within fall risk category. of 435 ft is outside age norm of 627 ft per Bohannon et al 2015. Anticipate pt would benefit from skilled PT to address strength and functional mobility deficits with aim of improving functional tolerance. No adverse events, pt tolerates session well overall without increase in pain or fatigue. Pt departs today's session in no acute distress, all voiced questions/concerns addressed appropriately from PT perspective.    OBJECTIVE IMPAIRMENTS: Abnormal gait, decreased activity tolerance, decreased endurance, decreased mobility, difficulty walking, decreased strength, and pain.   ACTIVITY LIMITATIONS: carrying, lifting, standing, and locomotion level  PARTICIPATION LIMITATIONS: meal prep, cleaning, laundry, community activity, and occupation  PERSONAL FACTORS: Time since onset of injury/illness/exacerbation and 1-2 comorbidities: cancer, PICC line  are also affecting patient's functional outcome.   REHAB POTENTIAL: Good  CLINICAL DECISION MAKING: Evolving/moderate complexity  EVALUATION COMPLEXITY: Moderate   GOALS: Goals reviewed with  patient? Yes  SHORT TERM GOALS: Target date: 01/18/2023 Pt will demonstrate appropriate understanding and performance of initially prescribed HEP in order to facilitate improved independence with management of symptoms.  Baseline: HEP provided on eval Goal status: INITIAL   2. Pt will improve at least MCID on FOTO in order to demonstrate improved perception of function due to symptoms.  Baseline: FOTO TBD  Goal status: INITIAL    LONG TERM GOALS: Target date: 02/15/2023 Pt will meet predicted score on FOTO in order to demonstrate improved perception of functional status due to symptoms.  Baseline: FOTO TBD Goal status: INITIAL  2.  Pt will demonstrate LE MMT of 4+/5 throughout major muscle groups in order to facilitate improved functional strength.  Baseline: see MMT chart above  Goal status: INITIAL   3.  Pt will increase to at least 679ft in order to indicative improved activity tolerance closer to age norm per Bohannon et al 2015.   Baseline: 423ft  Goal status: INITIAL  4. Pt will perform 5xSTS in </= 8 sec in order to demonstrate reduced fall risk and improved functional independence. (MCID of 2.3sec)  Baseline: 10 sec min UE use  Goal status: INITIAL   5. Pt will demonstrate appropriate performance of final prescribed HEP in order to facilitate improved self-management of symptoms post-discharge.   Baseline:  initial HEP prescribed  Goal status: INITIAL     PLAN:  PT FREQUENCY: 1-2x/week  PT DURATION: 8 weeks  PLANNED INTERVENTIONS: 97164- PT Re-evaluation, 97110-Therapeutic exercises, 97530- Therapeutic activity, [id number]- Neuromuscular re-education, 97535- Self Care, 40981- Manual therapy, [phone]- Gait training, Patient/Family education, Balance training, Stair training, Taping, and Cryotherapy.  PLAN FOR NEXT SESSION: Review/update HEP PRN. Working on gradual progression of activities, muscular strength/endurance. Quad/hip strengthening. Mindful of RUE PICC and cancer  hx   Ashley Murrain PT, DPT 12/21/2022 5:04 PM

## 2022-12-21 NOTE — Therapy (Incomplete)
OUTPATIENT PHYSICAL THERAPY EVALUATION   Patient Name: Marc Nielsen MRN: [id number] DOB:[birth year], 52 y.o., male Today's Date: 12/21/2022  END OF SESSION:   Past Medical History:  Diagnosis Date   History of anal fissures 03/08/2022   History of H. 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 Patient Active Problem List   Diagnosis Date Noted   Anemia 03/09/2022   Macrocytic anemia 03/08/2022   History of H. pylori infection 03/08/2022   AKI (acute kidney injury) (HCC) 03/08/2022   Subacromial bursitis of left shoulder joint 03/08/2022   Tear of right supraspinatus tendon 03/08/2022   History of anal fissures 03/08/2022    PCP: Rometta Emery MD   REFERRING PROVIDER: Laurie Panda  REFERRING DIAG: R53.81 (ICD-10-CM) - Other malaise  THERAPY DIAG:  No diagnosis found.  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  Yes: NPRS scale: ***/10 Pain location: *** Pain description: *** Aggravating factors: *** Relieving factors: ***  PRECAUTIONS: Other: cancer   RED FLAGS: {PT Red Flags:29287}   WEIGHT BEARING RESTRICTIONS: No  FALLS:  Has patient fallen in last 6 months? ***  LIVING ENVIRONMENT: Lives with: lives with their family and lives with their spouse Lives in: House/apartment Stairs: {opstairs:27293} Has following equipment at home: {Assistive  devices:23999}  OCCUPATION: [employer]   PLOF: Independent with basic ADLs, Vocation/Vocational requirements: ***, and Leisure: ***  PATIENT GOALS:I want ot   NEXT MD VISIT:   OBJECTIVE:  Note: Objective measures were completed at Evaluation unless otherwise noted.  DIAGNOSTIC FINDINGS:  ***  PATIENT SURVEYS:  FOTO ***  COGNITION: Overall cognitive status: Within functional limits for tasks assessed     SENSATION: WFL  POSTURE: ***  UPPER EXTREMITY ROM:   {AROM/PROM:27142} ROM Right eval Left eval  Shoulder flexion    Shoulder extension    Shoulder abduction    Shoulder adduction    Shoulder internal rotation    Shoulder external rotation    Elbow flexion    Elbow extension    Wrist flexion    Wrist extension  Wrist ulnar deviation    Wrist radial deviation    Wrist pronation    Wrist supination    (Blank rows = not tested)  UPPER EXTREMITY MMT:  MMT Right eval Left eval  Shoulder flexion    Shoulder extension    Shoulder abduction    Shoulder adduction    Shoulder internal rotation    Shoulder external rotation    Middle trapezius    Lower trapezius    Elbow flexion    Elbow extension    Wrist flexion    Wrist extension    Wrist ulnar deviation    Wrist radial deviation    Wrist pronation    Wrist supination    Grip strength (lbs)    (Blank rows = not tested)  SHOULDER SPECIAL TESTS: Impingement tests: {shoulder impingement test:25231:a} SLAP lesions: {SLAP lesions:25232} Instability tests: {shoulder instability test:25233} Rotator cuff assessment: {rotator cuff assessment:25234} Biceps assessment: {biceps assessment:25235}  JOINT MOBILITY TESTING:  ***  PALPATION:  ***   TODAY'S TREATMENT:                                                                                                                                         DATE: 12/09/22   PATIENT EDUCATION: Education details: *** Person educated: {Person  educated:25204} Education method: {Education Method:25205} Education comprehension: {Education Comprehension:25206}  HOME EXERCISE PROGRAM: ***  ASSESSMENT:  CLINICAL IMPRESSION: Patient is a 52 y.o. male who was seen today for physical therapy evaluation and treatment for fatigue in the setting of .   OBJECTIVE IMPAIRMENTS: {opptimpairments:25111}.   ACTIVITY LIMITATIONS: {activitylimitations:27494}  PARTICIPATION LIMITATIONS: {participationrestrictions:25113}  PERSONAL FACTORS: {Personal factors:25162} are also affecting patient's functional outcome.   REHAB POTENTIAL: {rehabpotential:25112}  CLINICAL DECISION MAKING: {clinical decision making:25114}  EVALUATION COMPLEXITY: {Evaluation complexity:25115}   GOALS: Goals reviewed with patient? No  SHORT TERM GOALS: Target date: 01/11/2023   Pt will be compliant and knowledgeable with initial HEP for improved comfort and carryover Baseline: initial HEP given  Goal status: INITIAL  2.  *** Baseline:  Goal status: INITIAL  LONG TERM GOALS: Target date: ***  *** Baseline:  Goal status: INITIAL  2.  *** Baseline:  Goal status: INITIAL  3.  *** Baseline:  Goal status: INITIAL  4.  *** Baseline:  Goal status: INITIAL  5.  *** Baseline:  Goal status: INITIAL  6.  *** Baseline:  Goal status: INITIAL  PLAN:  PT FREQUENCY: {rehab frequency:25116}  PT DURATION: {rehab duration:25117}  PLANNED INTERVENTIONS: {rehab planned interventions:25118::"97110-Therapeutic exercises","97530- Therapeutic [phone]- Neuromuscular re-education","97535- Self IONG","29528- Manual therapy"}  PLAN FOR NEXT SESSION: Eloy End, PT 12/21/2022, 7:48 AM

## 2022-12-26 ENCOUNTER — Other Ambulatory Visit (HOSPITAL_COMMUNITY): Payer: Self-pay

## 2022-12-26 ENCOUNTER — Other Ambulatory Visit: Payer: Self-pay

## 2022-12-26 DIAGNOSIS — Z79899 Other long term (current) drug therapy: Secondary | ICD-10-CM | POA: Diagnosis not present

## 2022-12-26 DIAGNOSIS — Z9481 Bone marrow transplant status: Secondary | ICD-10-CM | POA: Diagnosis not present

## 2022-12-26 DIAGNOSIS — C9201 Acute myeloblastic leukemia, in remission: Secondary | ICD-10-CM | POA: Diagnosis not present

## 2022-12-26 DIAGNOSIS — C9200 Acute myeloblastic leukemia, not having achieved remission: Secondary | ICD-10-CM | POA: Diagnosis not present

## 2022-12-26 DIAGNOSIS — Z9484 Stem cells transplant status: Secondary | ICD-10-CM | POA: Diagnosis not present

## 2022-12-26 DIAGNOSIS — Z431 Encounter for attention to gastrostomy: Secondary | ICD-10-CM | POA: Diagnosis not present

## 2022-12-26 MED ORDER — FOLIC ACID 1 MG PO TABS
1.0000 mg | ORAL_TABLET | Freq: Every day | ORAL | 3 refills | Status: DC
  Filled 2022-12-26: qty 30, 30d supply, fill #0
  Filled 2023-01-23: qty 30, 30d supply, fill #1
  Filled 2023-02-28: qty 30, 30d supply, fill #2
  Filled 2023-03-17 – 2023-03-24 (×2): qty 30, 30d supply, fill #3

## 2022-12-28 ENCOUNTER — Ambulatory Visit: Payer: Commercial Managed Care - PPO | Admitting: Physical Therapy

## 2022-12-30 ENCOUNTER — Other Ambulatory Visit: Payer: Self-pay

## 2023-01-02 DIAGNOSIS — Z9481 Bone marrow transplant status: Secondary | ICD-10-CM | POA: Diagnosis not present

## 2023-01-02 DIAGNOSIS — Z9484 Stem cells transplant status: Secondary | ICD-10-CM | POA: Diagnosis not present

## 2023-01-02 DIAGNOSIS — C9201 Acute myeloblastic leukemia, in remission: Secondary | ICD-10-CM | POA: Diagnosis not present

## 2023-01-02 DIAGNOSIS — C9200 Acute myeloblastic leukemia, not having achieved remission: Secondary | ICD-10-CM | POA: Diagnosis not present

## 2023-01-05 ENCOUNTER — Ambulatory Visit: Payer: Commercial Managed Care - PPO | Admitting: Physical Therapy

## 2023-01-09 ENCOUNTER — Other Ambulatory Visit (HOSPITAL_COMMUNITY): Payer: Self-pay

## 2023-01-09 ENCOUNTER — Other Ambulatory Visit: Payer: Self-pay

## 2023-01-09 MED ORDER — HYDROCORTISONE 1 % EX CREA
1.0000 | TOPICAL_CREAM | Freq: Two times a day (BID) | CUTANEOUS | 1 refills | Status: AC
  Filled 2023-01-09: qty 20, 7d supply, fill #0

## 2023-01-10 ENCOUNTER — Other Ambulatory Visit: Payer: Self-pay

## 2023-01-10 ENCOUNTER — Other Ambulatory Visit (HOSPITAL_COMMUNITY): Payer: Self-pay

## 2023-01-12 DIAGNOSIS — H524 Presbyopia: Secondary | ICD-10-CM | POA: Diagnosis not present

## 2023-01-12 DIAGNOSIS — H35363 Drusen (degenerative) of macula, bilateral: Secondary | ICD-10-CM | POA: Diagnosis not present

## 2023-01-13 ENCOUNTER — Telehealth: Payer: Self-pay | Admitting: Physical Therapy

## 2023-01-13 ENCOUNTER — Ambulatory Visit: Payer: Commercial Managed Care - PPO | Admitting: Physical Therapy

## 2023-01-13 NOTE — Telephone Encounter (Signed)
LVM for pt about NO SHOW appt this morning and reminded him of Jan 2 appt at 1:15. Additional appt was cancelled and pt will make any other appt one at a time.  Pt left message about attendance policy.   Garen Lah, PT, [facility] Certified Exercise Expert for the Aging Adult  01/13/23 12:40 PM Phone: [phone] Fax: [phone]

## 2023-01-16 DIAGNOSIS — Z9484 Stem cells transplant status: Secondary | ICD-10-CM | POA: Diagnosis not present

## 2023-01-16 DIAGNOSIS — Z9481 Bone marrow transplant status: Secondary | ICD-10-CM | POA: Diagnosis not present

## 2023-01-16 DIAGNOSIS — C9200 Acute myeloblastic leukemia, not having achieved remission: Secondary | ICD-10-CM | POA: Diagnosis not present

## 2023-01-16 DIAGNOSIS — C9201 Acute myeloblastic leukemia, in remission: Secondary | ICD-10-CM | POA: Diagnosis not present

## 2023-01-19 ENCOUNTER — Other Ambulatory Visit (HOSPITAL_COMMUNITY): Payer: Self-pay

## 2023-01-19 ENCOUNTER — Ambulatory Visit: Payer: Commercial Managed Care - PPO | Attending: Gerontology | Admitting: Physical Therapy

## 2023-01-19 ENCOUNTER — Encounter: Payer: Self-pay | Admitting: Physical Therapy

## 2023-01-19 DIAGNOSIS — R2689 Other abnormalities of gait and mobility: Secondary | ICD-10-CM

## 2023-01-19 DIAGNOSIS — M6281 Muscle weakness (generalized): Secondary | ICD-10-CM | POA: Diagnosis not present

## 2023-01-19 MED ORDER — HYDROXYZINE HCL 25 MG PO TABS
25.0000 mg | ORAL_TABLET | Freq: Three times a day (TID) | ORAL | 0 refills | Status: DC | PRN
  Filled 2023-01-19 (×2): qty 42, 14d supply, fill #0

## 2023-01-19 NOTE — Therapy (Signed)
 OUTPATIENT PHYSICAL THERAPY TREATMENT   Patient Name: Marc Nielsen MRN: [id number] DOB:1970/07/24, 53 y.o., male Today's Date: 01/19/2023  END OF SESSION:  PT End of Session - 01/19/23 1325     Visit Number 2    Number of Visits 17    Date for PT Re-Evaluation 02/15/23    Authorization Type [employer]    PT Start Time 1325   pt late   PT Stop Time 1400    PT Time Calculation (min) 35 min             Past Medical History:  Diagnosis Date   History of anal fissures 03/08/2022   History of H. 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 No pertinent surgical history. Patient Active Problem List   Diagnosis Date Noted   Anemia 03/09/2022   Macrocytic anemia 03/08/2022   History of H. pylori infection 03/08/2022   AKI (acute kidney injury) (HCC) 03/08/2022   Subacromial bursitis of left shoulder joint 03/08/2022   Tear of right supraspinatus tendon 03/08/2022   History of anal fissures 03/08/2022    PCP: Sim Emery CROME, MD  REFERRING PROVIDER: Londell Andrea KATHEE, NP  REFERRING DIAG: R53.81 (ICD-10-CM) - Other malaise  Rationale for Evaluation and Treatment: Rehabilitation  THERAPY DIAG:  Muscle weakness (generalized)  Other abnormalities of gait and mobility  ONSET DATE: ~4 months ago, a couple years ago for shoulders  SUBJECTIVE:                                                                                                                                                                                           SUBJECTIVE STATEMENT: Fell on christmas day, tripped while carrying something, no injury but was sore for a couple of days. Shoulders are still sore when I try to raise them up. I have completed the exercises. My wife made the appointments and got the calls about my attendance. I did not realize I missed the  appointments. My PICC line was removed a week ago.   EVAL: Pt states they want to work on shoulders and overall strength. 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 Has had shoulder issues for a couple years, was getting injections but hasn't had any since cancer diagnosis in February. States he had reduction in activity with his hospitalization/procedures and wants to get back to work by February next year if able. Had acute PT in the hospital, discharged home with HHPT, finished up with them  after a couple weeks. No N/T. No fevers/chills, no night sweats. States he follows with oncology weekly.   Accompanied by his son w/ pt consent.   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 No  LIVING ENVIRONMENT: 2 story house, 5-6 steps to bed/bath with rail 3-4STE Lives w/ wife who does majority of housework, has help with yardwork Does not require AD, has 4WW without seat. Used it just after bone marrow transplants  OCCUPATION: currently out of work, owns actor. Was working up until February and aims to get back to prior job demands  PLOF: Independent  PATIENT GOALS: get stronger, get back to work   NEXT MD VISIT: oncologist weekly visits   OBJECTIVE:  Note: Objective measures were completed at Evaluation unless otherwise noted.  DIAGNOSTIC FINDINGS:  Bone marrow ~4 months prior to eval per pt report  PATIENT SURVEYS:  FOTO deferred on eval given time constraints FOTO 01/19/23: 54%, predicted  65%  COGNITION: Overall cognitive status: Within functional limits for tasks assessed     SENSATION: No sensory complaints    RANGE OF MOTION:     Active  Right eval Left eval Right 01/19/23 Left 01/19/23  Shoulder flexion ~90 deg, deferred further given PICC 142deg mild pain 132 PICC line removed 132  Functional ER combo  C5    Functional IR combo      Knee extension      Ankle dorsiflexion       (Blank rows = not tested) (Key: WFL = within functional limits not formally assessed, * = concordant pain, s = stiffness/stretching sensation, NT = not tested)  Comments:    STRENGTH TESTING:  MMT Right eval Left eval  Shoulder flexion  4-  Shoulder abduction  4- *  Shoulder ER  4- *  Shoulder IR  5  Elbow flexion  5  Elbow extension  5  Grip strength (gross)    Hip flexion 4- (mild transient hip pain) 4  Hip abduction (modified sitting) 4 4  Knee flexion 4 4  Knee extension 4 4  Ankle dorsiflexion 4+ 4+  Ankle plantarflexion     (Blank rows = not tested) (Key: WFL = within functional limits not formally assessed, * = concordant pain, s = stiffness/stretching sensation, NT = not tested)  Comments:     FUNCTIONAL TESTS:  5xSTS: 10.08sec minimal UE support TUG: 9.52 no AD :  435 ft no AD   GAIT: Distance walked: within clinic Assistive device utilized: None Level of assistance: Complete Independence Comments: reduced gait speed/cadence, reduced truncal ROM   TODAY'S TREATMENT:  [facility] Adult PT Treatment:                                                DATE: 01/19/23 Therapeutic Exercise: Supine chest press Supine pullovers Supine AAROM ER  Supine horiz abdct AAROM  STS 5 x 2  Nustep L4 5 minutes UE/LE  Standing march 5 x 2 each  Standing hip abdct x 10 each  Heel raise x 10  Updated HEP  Therapeutic Activity: [facility] Adult  PT Treatment:                                                DATE: 12/22/22 Therapeutic Exercise: STS x5 Marches standing at counter w/ UE support x5 HEP handout + education    PATIENT EDUCATION:  Education details: Pt education on PT impairments, prognosis, and POC. Informed consent. Rationale for interventions, safe/appropriate HEP performance Person educated: Patient and son Education method: Explanation, Demonstration, Tactile cues, Verbal cues, and Handouts Education comprehension: verbalized understanding, returned demonstration, verbal cues required, tactile cues required, and needs further education    HOME EXERCISE PROGRAM: Access Code: [email or web address] URL: [email or web address] Date: 12/21/2022 Prepared by: Alm Jenny  Exercises - Sit to Stand  - 3-4 x daily - 1 sets - 5 reps - Standing March with Counter Support  - 3-4 x daily - 1 sets - 5 reps 01/19/23 - Supine shoulder Flexion Extension AAROM with Dowel to comfortable height overhead  - 1 x daily - 7 x weekly - 1-2 sets - 10 reps - Supine Shoulder External Rotation with Dowel  - 1 x daily - 7 x weekly - 1-2 sets - 10 reps - Supine Shoulder Horizontal Abduction Adduction AAROM with Dowel  - 1 x daily - 7 x weekly - 1-2 sets - 10 reps Walking on home treadmill 2 x per day for 5 minutes  - Standing Hip Abduction  - 1 x daily - 7 x weekly - 2 sets - 10 reps - Heel Raises with Counter Support  - 1 x daily - 7 x weekly - 2 sets - 10 reps  ASSESSMENT:  CLINICAL IMPRESSION: Pt reports recent removal of PICC line. Shoulder flexion ROM decreased for left, however equal bilateral on assessment today. Captured FOTO intake. Pt reports compliance with initial HEP. STG# 1 met. Reviewed HEP, started conditioning on Nustep, and progressed with shoulder AAROM. Pt arrived late so session shortened. He will schedule one appt at a time due to attendance. Will try to progress HEP each visit. Pt has treadmill at home and was encouraged  to walk 5 min 2 x per day and will increase next week if tolerating well.   EVAL: Patient is a pleasant 53 y.o. gentleman who was seen today for physical therapy evaluation and treatment for fatigue/malaise in setting of deconditioning s/p stem cell transplant. Pt endorses shoulder pain over past couple years and ongoing weakness since stem cell transplant and prolonged hospitalization. 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 On exam today pt demonstrates reduction LE strength, although 5xSTS and TUG are grossly WNL and not within fall risk category.  of 435 ft is outside age norm of 627 ft per Bohannon et al 2015. Anticipate pt would benefit from skilled PT to address strength and functional mobility deficits with aim of improving functional tolerance. No adverse events, pt tolerates session well overall without increase in pain or fatigue. Pt departs today's session in no acute distress, all voiced questions/concerns addressed appropriately from PT perspective.    OBJECTIVE IMPAIRMENTS: Abnormal gait, decreased activity tolerance, decreased endurance, decreased mobility, difficulty walking, decreased strength, and pain.   ACTIVITY LIMITATIONS: carrying, lifting, standing, and locomotion level  PARTICIPATION LIMITATIONS: meal prep, cleaning, laundry, community activity, and occupation  PERSONAL FACTORS: Time since onset of injury/illness/exacerbation and 1-2 comorbidities: cancer, PICC line  are also affecting patient's functional outcome.   REHAB POTENTIAL: Good  CLINICAL DECISION MAKING: Evolving/moderate complexity  EVALUATION COMPLEXITY: Moderate   GOALS: Goals reviewed with patient? Yes  SHORT TERM GOALS: Target date: 01/18/2023 Pt will demonstrate appropriate understanding and performance of initially prescribed HEP in order to facilitate improved  independence with management of symptoms.  Baseline: HEP provided on eval Goal status: MET  2. Pt will improve at least MCID on FOTO in order to demonstrate improved perception of function due to symptoms.  Baseline: FOTO TBD  01/19/23: FOTO 54, predicted 65%   Goal status: INITIAL   LONG TERM GOALS: Target date: 02/15/2023 Pt will meet predicted score on FOTO in order to demonstrate improved perception of functional status due to symptoms.  Baseline: FOTO TBD 01/19/23: FOTO 54, predicted 65%  Goal status: INITIAL  2.  Pt will demonstrate LE MMT of 4+/5 throughout major muscle groups in order to facilitate improved functional strength.  Baseline: see MMT chart above  Goal status: INITIAL   3.  Pt will increase to at least 635ft in order to indicative improved activity tolerance closer to age norm per Bohannon et al 2015.   Baseline: 4104ft  Goal status: INITIAL  4. Pt will perform 5xSTS in </= 8 sec in order to demonstrate reduced fall risk and improved functional independence. (MCID of 2.3sec)  Baseline: 10 sec min UE use  Goal status: INITIAL   5. Pt will demonstrate appropriate performance of final prescribed HEP in order to facilitate improved self-management of symptoms post-discharge.   Baseline: initial HEP prescribed  Goal status: INITIAL     PLAN:  PT FREQUENCY: 1-2x/week  PT DURATION: 8 weeks  PLANNED INTERVENTIONS: 97164- PT Re-evaluation, 97110-Therapeutic exercises, 97530- Therapeutic activity, [id number]- Neuromuscular re-education, 97535- Self Care, 02859- Manual therapy, [phone]- Gait training, Patient/Family education, Balance training, Stair training, Taping, and Cryotherapy.  PLAN FOR NEXT SESSION: Review/update HEP PRN. Working on gradual progression of activities, muscular strength/endurance. Quad/hip strengthening. Mindful of RUE PICC (PICC was removed late December)  and cancer hx   Harlene Persons, PTA 01/19/23 3:08 PM Phone: [phone] Fax: [phone]

## 2023-01-20 ENCOUNTER — Other Ambulatory Visit: Payer: Self-pay

## 2023-01-20 ENCOUNTER — Other Ambulatory Visit (HOSPITAL_COMMUNITY): Payer: Self-pay

## 2023-01-23 ENCOUNTER — Other Ambulatory Visit (HOSPITAL_COMMUNITY): Payer: Self-pay

## 2023-01-23 ENCOUNTER — Other Ambulatory Visit: Payer: Self-pay

## 2023-01-24 ENCOUNTER — Other Ambulatory Visit (HOSPITAL_COMMUNITY): Payer: Self-pay

## 2023-01-25 ENCOUNTER — Other Ambulatory Visit (HOSPITAL_COMMUNITY): Payer: Self-pay

## 2023-01-26 ENCOUNTER — Encounter: Payer: Commercial Managed Care - PPO | Admitting: Physical Therapy

## 2023-01-27 DIAGNOSIS — R942 Abnormal results of pulmonary function studies: Secondary | ICD-10-CM | POA: Diagnosis not present

## 2023-01-27 DIAGNOSIS — Z9484 Stem cells transplant status: Secondary | ICD-10-CM | POA: Diagnosis not present

## 2023-01-27 DIAGNOSIS — C9200 Acute myeloblastic leukemia, not having achieved remission: Secondary | ICD-10-CM | POA: Diagnosis not present

## 2023-01-30 ENCOUNTER — Other Ambulatory Visit: Payer: Self-pay

## 2023-01-30 DIAGNOSIS — Z9484 Stem cells transplant status: Secondary | ICD-10-CM | POA: Diagnosis not present

## 2023-01-30 DIAGNOSIS — C9200 Acute myeloblastic leukemia, not having achieved remission: Secondary | ICD-10-CM | POA: Diagnosis not present

## 2023-01-31 NOTE — Therapy (Signed)
 OUTPATIENT PHYSICAL THERAPY TREATMENT   Patient Name: Marc Nielsen MRN: [id number] DOB:07/29/1970, 53 y.o., male Today's Date: 02/01/2023  END OF SESSION:  PT End of Session - 02/01/23 1505     Visit Number 3    Number of Visits 17    Date for PT Re-Evaluation 02/15/23    Authorization Type cone aetna    PT Start Time 1500    PT Stop Time 1545    PT Time Calculation (min) 45 min    Activity Tolerance Patient tolerated treatment well    Behavior During Therapy [facility] for tasks assessed/performed              Past Medical History:  Diagnosis Date   History of anal fissures 03/08/2022   History of H. 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 No pertinent surgical history. Patient Active Problem List   Diagnosis Date Noted   Anemia 03/09/2022   Macrocytic anemia 03/08/2022   History of H. pylori infection 03/08/2022   AKI (acute kidney injury) (HCC) 03/08/2022   Subacromial bursitis of left shoulder joint 03/08/2022   Tear of right supraspinatus tendon 03/08/2022   History of anal fissures 03/08/2022    PCP: Davida Espy, MD  REFERRING PROVIDER: Alethea Hutchinson, NP  REFERRING DIAG: R53.81 (ICD-10-CM) - Other malaise  Rationale for Evaluation and Treatment: Rehabilitation  THERAPY DIAG:  Muscle weakness (generalized)  Other abnormalities of gait and mobility  ONSET DATE: ~4 months ago, a couple years ago for shoulders  SUBJECTIVE:                                                                                                                                                                                           SUBJECTIVE STATEMENT: Doing good.  Not having any pain. Walked on the treadmill a bit.  Doing his exercises.      EVAL: Pt states they want to work on shoulders and overall strength. Has felt a bit  weaker since bone marrow transplant about 4 months ago. Has had shoulder issues for a couple years, was getting injections but hasn't had any since cancer diagnosis in February. States he had reduction in activity with his hospitalization/procedures and wants to get back to work by February next year if able. Had acute PT in the hospital, discharged home with HHPT, finished up with them after a couple weeks. No N/T. No fevers/chills, no night sweats. States he follows with oncology weekly.   Accompanied by his son w/ pt  consent.   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 No  LIVING ENVIRONMENT: 2 story house, 5-6 steps to bed/bath with rail 3-4STE Lives w/ wife who does majority of housework, has help with yardwork Does not require AD, has 4WW without seat. 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  DIAGNOSTIC FINDINGS:  Bone marrow ~4 months prior to eval per pt report  PATIENT SURVEYS:  FOTO deferred on eval given time constraints FOTO 01/19/23: 54%, predicted 65%  COGNITION: Overall cognitive status: Within functional limits for tasks assessed     SENSATION: No sensory complaints    RANGE OF MOTION:      Active  Right eval Left eval Right 01/19/23 Left 01/19/23  Shoulder flexion ~90 deg, deferred further given PICC 142deg mild pain 132 PICC line removed 132  Functional ER combo  C5    Functional IR combo      Knee extension      Ankle dorsiflexion       (Blank rows = not tested) (Key: WFL = within functional limits not formally assessed, * = concordant pain, s = stiffness/stretching sensation, NT = not tested)  Comments:    STRENGTH TESTING:  MMT Right eval Left eval  Shoulder flexion  4-  Shoulder abduction  4- *  Shoulder ER  4- *  Shoulder IR  5  Elbow flexion  5  Elbow extension  5  Grip strength (gross)    Hip flexion 4- (mild transient hip pain) 4  Hip abduction (modified sitting) 4 4  Knee flexion 4 4  Knee extension 4 4  Ankle dorsiflexion 4+ 4+  Ankle plantarflexion     (Blank rows = not tested) (Key: WFL = within functional limits not formally assessed, * = concordant pain, s = stiffness/stretching sensation, NT = not tested)  Comments:     FUNCTIONAL TESTS:  5xSTS: 10.08sec minimal UE support TUG: 9.52 no AD :  435 ft no AD   GAIT: Distance walked: within clinic Assistive device utilized: None Level of assistance: Complete Independence Comments: reduced gait speed/cadence, reduced truncal ROM   TODAY'S TREATMENT:               [facility] Adult PT Treatment:                                                DATE: 02/01/23 Therapeutic Exercise: Supine scapular retraction  x 10 Horizontal pulls 2 x 10  Narrow grip red band 2 x 10  Diagonal pulls red band 2 x 10  Bridge x 10  SLR  x 15  Sit to stand with 5 lbs  x 10 x 2 sets added chest press  Standing march with 5 lbs wgt  Supported squats  Standing Airex for UE DB exercises: forward raise and alt. Lateral raise  NuStep L7, UE and LE for 6 min                                                                                                        [facility] Adult PT Treatment:                                                 DATE: 01/19/23 Therapeutic Exercise: Supine chest press Supine pullovers Supine AAROM ER  Supine horiz abdct AAROM  STS 5 x 2  Nustep L4 5 minutes UE/LE  Standing march 5 x 2 each  Standing hip abdct x 10 each  Heel raise x 10  Updated HEP  Therapeutic Activity: [facility] Adult PT Treatment:                                                DATE: 12/22/22 Therapeutic Exercise: STS x5 Marches standing at counter w/ UE support x5 HEP handout + education    PATIENT EDUCATION:  Education details: Pt education on PT impairments, prognosis, and POC. Informed consent. Rationale for interventions, safe/appropriate HEP performance Person educated: Patient and son Education method: Explanation, Demonstration, Tactile cues, Verbal cues, and Handouts Education comprehension: verbalized understanding, returned demonstration, verbal cues required, tactile cues required, and needs further education    HOME EXERCISE PROGRAM: Access Code: [email or web address] URL: [email or web address] Date: 12/21/2022 Prepared by: Mayme Spearman  Exercises - Sit to Stand  - 3-4 x daily - 1 sets - 5 reps - Standing March with Counter Support  - 3-4 x daily - 1 sets - 5 reps 01/19/23 - Supine shoulder Flexion Extension AAROM with Dowel to comfortable height overhead  - 1 x daily - 7 x weekly - 1-2 sets - 10 reps - Supine Shoulder External Rotation with Dowel  - 1 x daily - 7 x weekly - 1-2 sets - 10 reps - Supine Shoulder Horizontal Abduction Adduction AAROM with Dowel  - 1 x daily - 7 x weekly - 1-2 sets - 10 reps Walking on home treadmill 2 x per day for 5 minutes  - Standing Hip Abduction  - 1 x daily - 7 x weekly - 2 sets - 10 reps - Heel Raises with Counter Support  - 1 x daily - 7 x weekly - 2 sets - 10 reps  ASSESSMENT:  CLINICAL IMPRESSION:   Patient did very well today, able to work on upper and lower body exercises with appropriate rest breaks. He declined further HEP.  He was  challenged in standing  with UE exercises while standing on Airex pad.  Will continue to progress patient towards goals and maximize shoulder strength.    EVAL: Patient is a pleasant 53 y.o. gentleman who was seen today for physical therapy evaluation and treatment for fatigue/malaise in setting of deconditioning s/p stem cell transplant. Pt endorses shoulder pain over past couple years and ongoing weakness since stem cell transplant and prolonged hospitalization. 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 On exam today pt demonstrates reduction LE strength, although 5xSTS and TUG are grossly WNL and not within fall risk category. of 435 ft is outside age norm of 627 ft per Bohannon et al 2015. Anticipate pt would benefit from skilled PT to address strength and functional mobility deficits with aim of improving functional tolerance. No adverse events, pt tolerates session well overall without increase in pain or fatigue. Pt departs today's session in no acute distress, all voiced questions/concerns addressed appropriately from PT perspective.    OBJECTIVE IMPAIRMENTS: Abnormal gait, decreased activity tolerance, decreased endurance, decreased mobility, difficulty walking, decreased strength, and pain.   ACTIVITY LIMITATIONS: carrying, lifting, standing, and locomotion level  PARTICIPATION LIMITATIONS: meal prep, cleaning, laundry, community activity, and occupation  PERSONAL FACTORS: Time since onset of injury/illness/exacerbation and 1-2 comorbidities: cancer, PICC line  are also affecting patient's functional outcome.   REHAB POTENTIAL: Good  CLINICAL DECISION MAKING: Evolving/moderate complexity  EVALUATION COMPLEXITY: Moderate   GOALS: Goals reviewed with patient? Yes  SHORT TERM GOALS: Target date: 01/18/2023 Pt will demonstrate appropriate  understanding and performance of initially prescribed HEP in order to facilitate improved independence with management of symptoms.  Baseline: HEP provided on eval Goal status: MET  2. Pt will improve at least MCID on FOTO in order to demonstrate improved perception of function due to symptoms.  Baseline: FOTO TBD  01/19/23: FOTO 54, predicted 65%   Goal status: INITIAL   LONG TERM GOALS: Target date: 02/15/2023 Pt will meet predicted score on FOTO in order to demonstrate improved perception of functional status due to symptoms.  Baseline: FOTO TBD 01/19/23: FOTO 54, predicted 65%  Goal status: INITIAL  2.  Pt will demonstrate LE MMT of 4+/5 throughout major muscle groups in order to facilitate improved functional strength.  Baseline: see MMT chart above  Goal status: INITIAL   3.  Pt will increase to at least 684ft in order to indicative improved activity tolerance closer to age norm per Bohannon et al 2015.   Baseline: 479ft  Goal status: INITIAL  4. Pt will perform 5xSTS in </= 8 sec in order to demonstrate reduced fall risk and improved functional independence. (MCID of 2.3sec)  Baseline: 10 sec min UE use  Goal status: INITIAL   5. Pt will demonstrate appropriate performance of final prescribed HEP in order to facilitate improved self-management of symptoms post-discharge.   Baseline: initial HEP prescribed  Goal status: INITIAL     PLAN:  PT FREQUENCY: 1-2x/week  PT DURATION: 8 weeks  PLANNED INTERVENTIONS: 97164- PT Re-evaluation, 97110-Therapeutic exercises, 97530- Therapeutic activity, [id number]- Neuromuscular re-education, 97535- Self Care, 46962- Manual therapy, [phone]- Gait training, Patient/Family education, Balance training, Stair training, Taping, and Cryotherapy.  PLAN FOR NEXT SESSION: Review/update HEP PRN. Working on gradual progression of activities, muscular strength/endurance. Quad/hip strengthening. Mindful of RUE PICC (PICC was removed late December)  and  cancer hx  Marci Setter, PT 02/01/23 3:46 PM Phone: [phone] Fax: [phone]

## 2023-02-01 ENCOUNTER — Other Ambulatory Visit: Payer: Self-pay

## 2023-02-01 ENCOUNTER — Encounter: Payer: Self-pay | Admitting: Physical Therapy

## 2023-02-01 ENCOUNTER — Ambulatory Visit: Payer: Commercial Managed Care - PPO | Admitting: Physical Therapy

## 2023-02-01 DIAGNOSIS — R2689 Other abnormalities of gait and mobility: Secondary | ICD-10-CM

## 2023-02-01 DIAGNOSIS — M6281 Muscle weakness (generalized): Secondary | ICD-10-CM

## 2023-02-03 ENCOUNTER — Other Ambulatory Visit (HOSPITAL_COMMUNITY): Payer: Self-pay

## 2023-02-09 ENCOUNTER — Ambulatory Visit: Payer: Commercial Managed Care - PPO | Admitting: Physical Therapy

## 2023-02-09 ENCOUNTER — Encounter: Payer: Self-pay | Admitting: Physical Therapy

## 2023-02-09 DIAGNOSIS — R2689 Other abnormalities of gait and mobility: Secondary | ICD-10-CM

## 2023-02-09 DIAGNOSIS — M6281 Muscle weakness (generalized): Secondary | ICD-10-CM

## 2023-02-09 NOTE — Therapy (Signed)
OUTPATIENT PHYSICAL THERAPY TREATMENT   Patient Name: Marc Nielsen MRN: [id number] DOB:November 08, 1970, 53 y.o., male Today's Date: 02/09/2023  END OF SESSION:  PT End of Session - 02/09/23 1146     Visit Number 4    Number of Visits 17    Date for PT Re-Evaluation 02/15/23    Authorization Type cone aetna    PT Start Time 1145    PT Stop Time 1225    PT Time Calculation (min) 40 min              Past Medical History:  Diagnosis Date   History of anal fissures 03/08/2022   History of H. 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 No pertinent surgical history. Patient Active Problem List   Diagnosis Date Noted   Anemia 03/09/2022   Macrocytic anemia 03/08/2022   History of H. pylori infection 03/08/2022   AKI (acute kidney injury) (HCC) 03/08/2022   Subacromial bursitis of left shoulder joint 03/08/2022   Tear of right supraspinatus tendon 03/08/2022   History of anal fissures 03/08/2022    PCP: Rometta Emery, MD  REFERRING PROVIDER: Laurie Panda, NP  REFERRING DIAG: R53.81 (ICD-10-CM) - Other malaise  Rationale for Evaluation and Treatment: Rehabilitation  THERAPY DIAG:  Muscle weakness (generalized)  Other abnormalities of gait and mobility  ONSET DATE: ~4 months ago, a couple years ago for shoulders  SUBJECTIVE:                                                                                                                                                                                           SUBJECTIVE STATEMENT: Doing good.  Not having any pain.  Doing his exercises.      EVAL: Pt states they want to work on shoulders and overall strength. 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 Has had shoulder issues for a couple years, was getting injections but hasn't had any since  cancer diagnosis in February. States he had reduction in activity with his hospitalization/procedures and wants to get back to work by February next year if able. Had acute PT in the hospital, discharged home with HHPT, finished up with them after a couple weeks. No N/T. No fevers/chills, no night sweats. States he follows with oncology weekly.   Accompanied by his son w/ pt consent.   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 No  LIVING ENVIRONMENT: 2 story house, 5-6 steps to bed/bath with rail 3-4STE Lives w/ wife who does majority of housework, has help with yardwork Does not require AD, has 4WW without seat. 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  DIAGNOSTIC FINDINGS:  Bone marrow ~4 months prior to eval per pt report  PATIENT SURVEYS:  FOTO deferred on eval given time constraints FOTO 01/19/23: 54%, predicted 65%  COGNITION: Overall cognitive status: Within functional limits for tasks assessed     SENSATION: No sensory complaints    RANGE OF MOTION:     Active  Right eval Left eval Right 01/19/23 Left 01/19/23  Shoulder flexion ~90 deg, deferred further given PICC 142deg mild pain 132 PICC line  removed 132  Functional ER combo  C5    Functional IR combo      Knee extension      Ankle dorsiflexion       (Blank rows = not tested) (Key: WFL = within functional limits not formally assessed, * = concordant pain, s = stiffness/stretching sensation, NT = not tested)  Comments:    STRENGTH TESTING:  MMT Right eval Left eval  Shoulder flexion  4-  Shoulder abduction  4- *  Shoulder ER  4- *  Shoulder IR  5  Elbow flexion  5  Elbow extension  5  Grip strength (gross)    Hip flexion 4- (mild transient hip pain) 4  Hip abduction (modified sitting) 4 4  Knee flexion 4 4  Knee extension 4 4  Ankle dorsiflexion 4+ 4+  Ankle plantarflexion     (Blank rows = not tested) (Key: WFL = within functional limits not formally assessed, * = concordant pain, s = stiffness/stretching sensation, NT = not tested)  Comments:     FUNCTIONAL TESTS:  5xSTS: 10.08sec minimal UE support: 02/09/23: 5 x STS: 9.8 sec  TUG: 9.52 no AD :  435 ft no AD   GAIT: Distance walked: within clinic Assistive device utilized: None Level of assistance: Complete Independence Comments: reduced gait speed/cadence, reduced truncal ROM   TODAY'S TREATMENT:              [facility] Adult PT Treatment:                                                DATE: 02/09/23 Therapeutic Exercise: NuStep L7, UE and LE for 6 min  Heel raise x 20  Supported squats 10 x 2 at free motion bar  Standing march holding 6 lbs wgt 10 x 2 each  Standing Airex for UE 2# DB exercises: forward raise and alt. Lateral raise x 8 each - pt reports min pain in left shoulder Standing AIREX bilateral  scaption AROM x 8 Bridge x 10 , single leg bridge 2 x 10 each  SLR x 15 each  Supine GTB horiz pulls x 20  Supine Narrow grip  GTB pullovers 10 x 2  Supine Diagonals GTB diagonals 5 x 2 each    [facility] Adult PT Treatment:                                                DATE: 02/01/23 Therapeutic Exercise: Supine scapular retraction  x 10 Horizontal  pulls 2 x 10  Narrow grip red band 2 x 10  Diagonal pulls red band 2 x 10  Bridge x 10  SLR  x 15  Sit to stand with 5 lbs x 10 x 2 sets added chest press  Standing march with 5 lbs wgt  Supported squats  Standing Airex for UE DB exercises: forward raise and alt. Lateral raise  NuStep L7, UE and LE for 6 min                                                                                                        [facility] Adult PT Treatment:                                                DATE: 01/19/23 Therapeutic Exercise: Supine chest press Supine pullovers Supine AAROM ER  Supine horiz abdct AAROM  STS 5 x 2  Nustep L4 5 minutes UE/LE  Standing march 5 x 2 each  Standing hip abdct x 10 each  Heel raise x 10  Updated HEP  Therapeutic Activity: [facility] Adult PT Treatment:                                                DATE: 12/22/22 Therapeutic Exercise: STS x5 Marches standing at counter w/ UE support x5 HEP handout + education    PATIENT EDUCATION:  Education details: Pt education on PT impairments, prognosis, and POC. Informed consent. Rationale for interventions, safe/appropriate HEP performance Person educated: Patient and son Education method: Explanation, Demonstration, Tactile cues, Verbal cues, and Handouts Education comprehension: verbalized understanding, returned demonstration, verbal cues required, tactile cues required, and needs further education    HOME EXERCISE PROGRAM: Access Code: [email or web address] URL: [email or web address] Date: 12/21/2022 Prepared by: Fransisco Hertz  Exercises - Sit to Stand  - 3-4 x daily - 1 sets - 5 reps - Standing March with Counter Support  - 3-4 x daily - 1 sets - 5 reps 01/19/23 - Supine shoulder Flexion Extension AAROM with Dowel to comfortable height overhead  - 1 x daily - 7 x weekly - 1-2 sets -  10 reps - Supine Shoulder External Rotation with Dowel  - 1 x daily - 7 x weekly - 1-2 sets - 10 reps - Supine Shoulder  Horizontal Abduction Adduction AAROM with Dowel  - 1 x daily - 7 x weekly - 1-2 sets - 10 reps Walking on home treadmill 2 x per day for 5 minutes  - Standing Hip Abduction  - 1 x daily - 7 x weekly - 2 sets - 10 reps - Heel Raises with Counter Support  - 1 x daily - 7 x weekly - 2 sets - 10 reps  ASSESSMENT:  CLINICAL IMPRESSION: Pt reports he is feeling a little more stamina. He reports consistency with his HEP and his walking program on home Treadmill.  Increased pain with shoulder raises especially left shoulder with popping. His 5 x STS has improved. Continued with general conditioning and shoulder strengthening as tolerated, being mindful of medical history. He has reached the end of his 8 week POC and has only been seen for 3 treatments due to attendance and clinic policy. He has attended his last 2 scheduled sessions. He would benefit from re-assessment by PT to consider extending POC and allowing him to schedule per POC.    EVAL: Patient is a pleasant 53 y.o. gentleman who was seen today for physical therapy evaluation and treatment for fatigue/malaise in setting of deconditioning s/p stem cell transplant. Pt endorses shoulder pain over past couple years and ongoing weakness since stem cell transplant and prolonged hospitalization. 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 On exam today pt demonstrates reduction LE strength, although 5xSTS and TUG are grossly WNL and not within fall risk category. of 435 ft is outside age norm of 627 ft per Bohannon et al 2015. Anticipate pt would benefit from skilled PT to address strength and functional mobility deficits with aim of improving functional tolerance. No adverse events, pt tolerates session well overall without increase in pain or fatigue. Pt departs today's session in no acute distress, all voiced  questions/concerns addressed appropriately from PT perspective.    OBJECTIVE IMPAIRMENTS: Abnormal gait, decreased activity tolerance, decreased endurance, decreased mobility, difficulty walking, decreased strength, and pain.   ACTIVITY LIMITATIONS: carrying, lifting, standing, and locomotion level  PARTICIPATION LIMITATIONS: meal prep, cleaning, laundry, community activity, and occupation  PERSONAL FACTORS: Time since onset of injury/illness/exacerbation and 1-2 comorbidities: cancer, PICC line  are also affecting patient's functional outcome.   REHAB POTENTIAL: Good  CLINICAL DECISION MAKING: Evolving/moderate complexity  EVALUATION COMPLEXITY: Moderate   GOALS: Goals reviewed with patient? Yes  SHORT TERM GOALS: Target date: 01/18/2023 Pt will demonstrate appropriate understanding and performance of initially prescribed HEP in order to facilitate improved independence with management of symptoms.  Baseline: HEP provided on eval Goal status: MET  2. Pt will improve at least MCID on FOTO in order to demonstrate improved perception of function due to symptoms.  Baseline: FOTO TBD  01/19/23: FOTO 54, predicted 65%   Goal status: INITIAL   LONG TERM GOALS: Target date: 02/15/2023 Pt will meet predicted score on FOTO in order to demonstrate improved perception of functional status due to symptoms.  Baseline: FOTO TBD 01/19/23: FOTO 54, predicted 65%  Goal status: INITIAL  2.  Pt will demonstrate LE MMT of 4+/5 throughout major muscle groups in order to facilitate improved functional strength.  Baseline: see MMT chart above  Goal status: INITIAL  3.  Pt will increase to at least 669ft in order to indicative improved activity tolerance closer to age norm per Bohannon et al 2015.   Baseline: 463ft  Goal status: INITIAL  4. Pt will perform 5xSTS in </= 8 sec in order to demonstrate reduced fall risk and improved functional independence. (MCID of 2.3sec)  Baseline: 10 sec min UE  use  02/09/23: 9.8 sec without UE  Goal status: ONGOING  5. Pt will demonstrate appropriate performance of final prescribed HEP in order to facilitate improved self-management of symptoms post-discharge.   Baseline: initial HEP prescribed  Goal status: INITIAL     PLAN:  PT FREQUENCY: 1-2x/week  PT DURATION: 8 weeks  PLANNED INTERVENTIONS: 97164- PT Re-evaluation, 97110-Therapeutic exercises, 97530- Therapeutic activity, [id number]- Neuromuscular re-education, 97535- Self Care, 16109- Manual therapy, [phone]- Gait training, Patient/Family education, Balance training, Stair training, Taping, and Cryotherapy.  PLAN FOR NEXT SESSION: Review/update HEP PRN. Working on gradual progression of activities, muscular strength/endurance. Quad/hip strengthening. Mindful of RUE PICC (PICC was removed late December)  and cancer hx  Jannette Spanner, PTA 02/09/23 12:50 PM Phone: [phone] Fax: [phone]

## 2023-02-13 ENCOUNTER — Other Ambulatory Visit (HOSPITAL_COMMUNITY): Payer: Self-pay

## 2023-02-14 ENCOUNTER — Other Ambulatory Visit (HOSPITAL_COMMUNITY): Payer: Self-pay

## 2023-02-14 MED ORDER — DOXAZOSIN MESYLATE 4 MG PO TABS
4.0000 mg | ORAL_TABLET | Freq: Every day | ORAL | 3 refills | Status: DC
  Filled 2023-02-14: qty 30, 30d supply, fill #0
  Filled 2023-03-22: qty 30, 30d supply, fill #1
  Filled 2023-04-17: qty 30, 30d supply, fill #0
  Filled 2023-05-16: qty 30, 30d supply, fill #1
  Filled 2023-05-22: qty 30, 30d supply, fill #0

## 2023-02-16 ENCOUNTER — Ambulatory Visit: Payer: Commercial Managed Care - PPO | Admitting: Physical Therapy

## 2023-02-16 ENCOUNTER — Encounter: Payer: Self-pay | Admitting: Physical Therapy

## 2023-02-16 DIAGNOSIS — R2689 Other abnormalities of gait and mobility: Secondary | ICD-10-CM | POA: Diagnosis not present

## 2023-02-16 DIAGNOSIS — M6281 Muscle weakness (generalized): Secondary | ICD-10-CM | POA: Diagnosis not present

## 2023-02-16 NOTE — Therapy (Signed)
OUTPATIENT PHYSICAL THERAPY PROGRESS NOTE   Patient Name: Marc Nielsen MRN: [id number] DOB:July 19, 1970, 53 y.o., male Today's Date: 02/16/2023  END OF SESSION:  PT End of Session - 02/16/23 1401     Visit Number 5    Number of Visits 17    PT Start Time 1400    PT Stop Time 1425    PT Time Calculation (min) 25 min    Activity Tolerance Patient tolerated treatment well    Behavior During Therapy WFL for tasks assessed/performed            PHYSICAL THERAPY DISCHARGE SUMMARY  Visits from Start of Care: 5  Current functional level related to goals / functional outcomes: See assessment   Remaining deficits: none   Education / Equipment: Long term HEP   Patient agrees to discharge. Patient goals were met. Patient is being discharged due to meeting the stated rehab goals. And pt request    Past Medical History:  Diagnosis Date   History of anal fissures 03/08/2022   History of H. 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 No pertinent surgical history. Patient Active Problem List   Diagnosis Date Noted   Anemia 03/09/2022   Macrocytic anemia 03/08/2022   History of H. pylori infection 03/08/2022   AKI (acute kidney injury) (HCC) 03/08/2022   Subacromial bursitis of left shoulder joint 03/08/2022   Tear of right supraspinatus tendon 03/08/2022   History of anal fissures 03/08/2022    PCP: Rometta Emery, MD  REFERRING PROVIDER: Laurie Panda, NP  REFERRING DIAG: R53.81 (ICD-10-CM) - Other malaise  Rationale for Evaluation and Treatment: Rehabilitation  THERAPY DIAG:  Muscle weakness (generalized)  Other abnormalities of gait and mobility  ONSET DATE: ~4 months ago, a couple years ago for shoulders  SUBJECTIVE:                                                                                                                                                                                            SUBJECTIVE STATEMENT:  Pt feels good today, has no pain and reports feeling many times better than he did at initial encounter. Pt stated that he wishes to be discharged from [facility] so that he may manage his symptoms at home using long term HEP.     EVAL: Pt states they want to work on shoulders and overall strength. 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 Has had shoulder issues for a couple years, was  getting injections but hasn't had any since cancer diagnosis in February. States he had reduction in activity with his hospitalization/procedures and wants to get back to work by February next year if able. Had acute PT in the hospital, discharged home with HHPT, finished up with them after a couple weeks. No N/T. No fevers/chills, no night sweats. States he follows with oncology weekly.   Accompanied by his son w/ pt consent.   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 No  LIVING ENVIRONMENT: 2 story house, 5-6 steps to bed/bath with rail 3-4STE Lives w/ wife who does majority of housework, has help with yardwork Does not require AD, has 4WW without seat. Used it just after bone marrow transplants  OCCUPATION: currently out of work, owns Actor. Was working up until February and aims to get back to prior job demands  PLOF: Independent  PATIENT GOALS: get stronger, get back to work   NEXT MD VISIT: oncologist weekly visits    OBJECTIVE:  Note: Objective measures were completed at Evaluation unless otherwise noted.  DIAGNOSTIC FINDINGS:  Bone marrow ~4 months prior to eval per pt report  PATIENT SURVEYS:  FOTO deferred on eval given time constraints FOTO 01/19/23: 54%, predicted 65% FOTO 02/16/2023: 68%  COGNITION: Overall cognitive status: Within functional limits for tasks assessed     SENSATION: No sensory complaints    RANGE OF MOTION:     Active  Right eval Left eval Right 01/19/23 Left 01/19/23  Shoulder flexion ~90 deg, deferred further given PICC 142deg mild pain 132 PICC line removed 132  Functional ER combo  C5    Functional IR combo      Knee extension      Ankle dorsiflexion       (Blank rows = not tested) (Key: WFL = within functional limits not formally assessed, * = concordant pain, s = stiffness/stretching sensation, NT = not tested)  Comments:    STRENGTH TESTING:  MMT Right eval Left eval Left  Shoulder flexion  4- 4+  Shoulder abduction  4- * 4+  Shoulder ER  4- * 4+  Shoulder IR  5 5  Elbow flexion  5 5  Elbow extension  5 5  Grip strength (gross)     Hip flexion 4- (mild transient hip pain) 4 4+  Hip abduction (modified sitting) 4 4 4+  Knee flexion 4 4 4+  Knee extension 4 4 4+  Ankle dorsiflexion 4+ 4+ 4+  Ankle plantarflexion   4+   (Blank rows = not tested) (Key: WFL = within functional limits not formally assessed, * = concordant pain, s = stiffness/stretching sensation, NT = not tested)  Comments:     FUNCTIONAL TESTS:  5xSTS: 10.08sec minimal UE support: 02/09/23: 5 x STS: 9.8 sec  02/16/2023: 6.59s TUG: 9.52 no AD :  435 ft no AD  450 ft no AD  GAIT: Distance walked: within clinic Assistive device utilized: None Level of assistance: Complete Independence Comments: reduced gait speed/cadence, reduced truncal ROM  [facility] Adult PT Treatment:  DATE: 02/16/2023  Therapeutic Activity: Re-evaluative  measurements POC discussion    [facility] Adult PT Treatment:                                                DATE: 02/09/23 Therapeutic Exercise: NuStep L7, UE and LE for 6 min  Heel raise x 20  Supported squats 10 x 2 at free motion bar  Standing march holding 6 lbs wgt 10 x 2 each  Standing Airex for UE 2# DB exercises: forward raise and alt. Lateral raise x 8 each - pt reports min pain in left shoulder Standing AIREX bilateral scaption AROM x 8 Bridge x 10 , single leg bridge 2 x 10 each  SLR x 15 each  Supine GTB horiz pulls x 20  Supine Narrow grip  GTB pullovers 10 x 2  Supine Diagonals GTB diagonals 5 x 2 each      PATIENT EDUCATION:  Education details: Pt education on PT impairments, prognosis, and POC. Informed consent. Rationale for interventions, safe/appropriate HEP performance Person educated: Patient and son Education method: Explanation, Demonstration, Tactile cues, Verbal cues, and Handouts Education comprehension: verbalized understanding, returned demonstration, verbal cues required, tactile cues required, and needs further education    HOME EXERCISE PROGRAM: Access Code: [email or web address] URL: [email or web address] Date: 12/21/2022 Prepared by: Fransisco Hertz  Exercises - Sit to Stand  - 3-4 x daily - 1 sets - 5 reps - Standing March with Counter Support  - 3-4 x daily - 1 sets - 5 reps 01/19/23 - Supine shoulder Flexion Extension AAROM with Dowel to comfortable height overhead  - 1 x daily - 7 x weekly - 1-2 sets - 10 reps - Supine Shoulder External Rotation with Dowel  - 1 x daily - 7 x weekly - 1-2 sets - 10 reps - Supine Shoulder Horizontal Abduction Adduction AAROM with Dowel  - 1 x daily - 7 x weekly - 1-2 sets - 10 reps Walking on home treadmill 2 x per day for 5 minutes  - Standing Hip Abduction  - 1 x daily - 7 x weekly - 2 sets - 10 reps - Heel Raises with Counter Support  - 1 x daily - 7 x weekly - 2 sets - 10 reps  ASSESSMENT:  CLINICAL  IMPRESSION: Pt attended physical therapy session for continuation of treatment regarding general deconditioning . Pt showed  great tolerance to today's activities and demonstrated improvement with all aspects of POC. As of this session, Pt has met all therapeutic goals aside from one. The one goal not met was walking speed with , pt reported being able to walk faster comfortably if he chose too. No difficulties with HEP, pt declined any change to HEP wishing to keep current one as his long term HEP. Pt reported wanting to be discharge and feels confident that he can manage his symptoms and function at home. Pt required minimal cuing and no assistance for safe and appropriate performance of today's activities. Pt is to be discharged at the completion of session due to completion of most therapeutic goals and per pt request.   EVAL: Patient is a pleasant 53 y.o. gentleman who was seen today for physical therapy evaluation and treatment for fatigue/malaise in setting of deconditioning s/p stem cell transplant. Pt endorses shoulder pain over past couple years and ongoing weakness since stem cell transplant  and prolonged hospitalization. 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 On exam today pt demonstrates reduction LE strength, although 5xSTS and TUG are grossly WNL and not within fall risk category. of 435 ft is outside age norm of 627 ft per Bohannon et al 2015. Anticipate pt would benefit from skilled PT to address strength and functional mobility deficits with aim of improving functional tolerance. No adverse events, pt tolerates session well overall without increase in pain or fatigue. Pt departs today's session in no acute distress, all voiced questions/concerns addressed appropriately from PT perspective.    OBJECTIVE IMPAIRMENTS: Abnormal gait, decreased  activity tolerance, decreased endurance, decreased mobility, difficulty walking, decreased strength, and pain.   ACTIVITY LIMITATIONS: carrying, lifting, standing, and locomotion level  PARTICIPATION LIMITATIONS: meal prep, cleaning, laundry, community activity, and occupation  PERSONAL FACTORS: Time since onset of injury/illness/exacerbation and 1-2 comorbidities: cancer, PICC line  are also affecting patient's functional outcome.   REHAB POTENTIAL: Good  CLINICAL DECISION MAKING: Evolving/moderate complexity  EVALUATION COMPLEXITY: Moderate   GOALS: Goals reviewed with patient? Yes  SHORT TERM GOALS: Target date: 01/18/2023 Pt will demonstrate appropriate understanding and performance of initially prescribed HEP in order to facilitate improved independence with management of symptoms.  Baseline: HEP provided on eval Goal status:  MET 02/16/2023  2. Pt will improve at least MCID on FOTO in order to demonstrate improved perception of function due to symptoms.  Baseline: FOTO TBD  01/19/23: FOTO 54, predicted 65%   Goal status:  MET 02/16/2023  LONG TERM GOALS: Target date: 02/15/2023 Pt will meet predicted score on FOTO in order to demonstrate improved perception of functional status due to symptoms.  Baseline: FOTO TBD 01/19/23: FOTO 54, predicted 65%  Goal status: MET 02/16/2023  2.  Pt will demonstrate LE MMT of 4+/5 throughout major muscle groups in order to facilitate improved functional strength.  Baseline: see MMT chart above  Goal status:  MET 02/16/2023   3.  Pt will increase to at least 667ft in order to indicative improved activity tolerance closer to age norm per Bohannon et al 2015.   Baseline: 469ft  Goal status: INITIAL  4. Pt will perform 5xSTS in </= 8 sec in order to demonstrate reduced fall risk and improved functional independence. (MCID of 2.3sec)  Baseline: 10 sec min UE use  02/09/23: 9.8 sec without UE  Goal status: MET 02/16/2023  5. Pt will demonstrate  appropriate performance of final prescribed HEP in order to facilitate improved self-management of symptoms post-discharge.   Baseline: initial HEP prescribed  Goal status:  MET 02/16/2023    PLAN:  PT FREQUENCY: 1-2x/week  PT DURATION: 8 weeks  PLANNED INTERVENTIONS: 97164- PT Re-evaluation, 97110-Therapeutic exercises, 97530- Therapeutic activity, 97112- Neuromuscular re-education, 97535- Self Care, 16109- Manual therapy, [phone]- Gait training, Patient/Family education, Balance training, Stair training, Taping, and Cryotherapy.  PLAN FOR NEXT SESSION: Review/update HEP PRN. Working on gradual progression of activities, muscular strength/endurance. Quad/hip strengthening. Mindful of RUE PICC (PICC was removed late December)  and cancer hx  Sheliah Plane, PT, DPT 02/16/2023, 2:33 PM

## 2023-02-22 DIAGNOSIS — Z9484 Stem cells transplant status: Secondary | ICD-10-CM | POA: Diagnosis not present

## 2023-02-22 DIAGNOSIS — Z79899 Other long term (current) drug therapy: Secondary | ICD-10-CM | POA: Diagnosis not present

## 2023-02-22 DIAGNOSIS — Z7960 Long term (current) use of unspecified immunomodulators and immunosuppressants: Secondary | ICD-10-CM | POA: Diagnosis not present

## 2023-02-22 DIAGNOSIS — Z5181 Encounter for therapeutic drug level monitoring: Secondary | ICD-10-CM | POA: Diagnosis not present

## 2023-02-22 DIAGNOSIS — C9200 Acute myeloblastic leukemia, not having achieved remission: Secondary | ICD-10-CM | POA: Diagnosis not present

## 2023-02-28 ENCOUNTER — Other Ambulatory Visit (HOSPITAL_COMMUNITY): Payer: Self-pay

## 2023-03-10 ENCOUNTER — Other Ambulatory Visit: Payer: Self-pay

## 2023-03-10 ENCOUNTER — Other Ambulatory Visit (HOSPITAL_COMMUNITY): Payer: Self-pay

## 2023-03-10 MED ORDER — NYSTATIN 100000 UNIT/GM EX POWD
CUTANEOUS | 1 refills | Status: DC
Start: 2023-03-10 — End: 2023-08-08
  Filled 2023-03-10 – 2023-04-17 (×3): qty 30, 15d supply, fill #0

## 2023-03-17 ENCOUNTER — Other Ambulatory Visit (HOSPITAL_COMMUNITY): Payer: Self-pay

## 2023-03-22 ENCOUNTER — Other Ambulatory Visit (HOSPITAL_COMMUNITY): Payer: Self-pay

## 2023-03-29 ENCOUNTER — Other Ambulatory Visit (HOSPITAL_COMMUNITY): Payer: Self-pay

## 2023-03-30 ENCOUNTER — Other Ambulatory Visit (HOSPITAL_COMMUNITY): Payer: Self-pay

## 2023-04-04 ENCOUNTER — Other Ambulatory Visit (HOSPITAL_COMMUNITY): Payer: Self-pay

## 2023-04-05 ENCOUNTER — Other Ambulatory Visit (HOSPITAL_COMMUNITY): Payer: Self-pay

## 2023-04-05 DIAGNOSIS — C9200 Acute myeloblastic leukemia, not having achieved remission: Secondary | ICD-10-CM | POA: Diagnosis not present

## 2023-04-05 DIAGNOSIS — L299 Pruritus, unspecified: Secondary | ICD-10-CM | POA: Diagnosis not present

## 2023-04-05 DIAGNOSIS — L853 Xerosis cutis: Secondary | ICD-10-CM | POA: Diagnosis not present

## 2023-04-05 DIAGNOSIS — Z9484 Stem cells transplant status: Secondary | ICD-10-CM | POA: Diagnosis not present

## 2023-04-05 MED ORDER — TRIAMCINOLONE ACETONIDE 0.1 % EX CREA
TOPICAL_CREAM | Freq: Two times a day (BID) | CUTANEOUS | 3 refills | Status: AC | PRN
Start: 2023-04-05 — End: ?
  Filled 2023-04-05: qty 454, 30d supply, fill #0
  Filled 2023-05-11: qty 454, 30d supply, fill #1

## 2023-04-17 ENCOUNTER — Other Ambulatory Visit (HOSPITAL_BASED_OUTPATIENT_CLINIC_OR_DEPARTMENT_OTHER): Payer: Self-pay

## 2023-04-17 ENCOUNTER — Other Ambulatory Visit (HOSPITAL_COMMUNITY): Payer: Self-pay

## 2023-04-17 ENCOUNTER — Other Ambulatory Visit: Payer: Self-pay

## 2023-04-17 MED ORDER — FOLIC ACID 1 MG PO TABS
1.0000 mg | ORAL_TABLET | Freq: Every day | ORAL | 3 refills | Status: DC
Start: 2023-04-17 — End: 2023-09-21
  Filled 2023-05-10: qty 30, 30d supply, fill #0
  Filled 2023-06-05: qty 30, 30d supply, fill #1
  Filled 2023-07-04 – 2023-07-10 (×2): qty 30, 30d supply, fill #2
  Filled 2023-07-10: qty 30, 30d supply, fill #0
  Filled 2023-08-08: qty 30, 30d supply, fill #1

## 2023-04-27 ENCOUNTER — Other Ambulatory Visit (HOSPITAL_COMMUNITY): Payer: Self-pay

## 2023-04-27 MED ORDER — METOPROLOL TARTRATE 25 MG PO TABS
12.5000 mg | ORAL_TABLET | Freq: Two times a day (BID) | ORAL | 3 refills | Status: DC
  Filled 2023-04-27: qty 30, 30d supply, fill #0
  Filled 2023-05-29: qty 30, 30d supply, fill #1
  Filled 2023-06-27: qty 30, 30d supply, fill #2
  Filled 2023-07-28: qty 30, 30d supply, fill #3

## 2023-05-01 ENCOUNTER — Other Ambulatory Visit (HOSPITAL_COMMUNITY): Payer: Self-pay

## 2023-05-10 ENCOUNTER — Encounter (HOSPITAL_COMMUNITY): Payer: Self-pay

## 2023-05-10 ENCOUNTER — Other Ambulatory Visit: Payer: Self-pay

## 2023-05-10 ENCOUNTER — Other Ambulatory Visit (HOSPITAL_COMMUNITY): Payer: Self-pay

## 2023-05-10 MED ORDER — SULFAMETHOXAZOLE-TRIMETHOPRIM 800-160 MG PO TABS
1.0000 | ORAL_TABLET | ORAL | 3 refills | Status: DC
  Filled 2023-05-10: qty 36, 84d supply, fill #0
  Filled 2023-08-01: qty 36, 84d supply, fill #1
  Filled 2023-10-30: qty 36, 84d supply, fill #2

## 2023-05-10 MED ORDER — PENICILLIN V POTASSIUM 500 MG PO TABS
500.0000 mg | ORAL_TABLET | Freq: Two times a day (BID) | ORAL | 3 refills | Status: DC
  Filled 2023-05-10: qty 180, 90d supply, fill #0
  Filled 2023-08-08: qty 180, 90d supply, fill #1

## 2023-05-10 MED ORDER — JAKAFI 10 MG PO TABS
10.0000 mg | ORAL_TABLET | Freq: Two times a day (BID) | ORAL | 5 refills | Status: DC
  Filled 2023-05-10 – 2023-05-11 (×3): qty 60, 30d supply, fill #0

## 2023-05-10 MED ORDER — POSACONAZOLE 100 MG PO TBEC
300.0000 mg | DELAYED_RELEASE_TABLET | Freq: Every day | ORAL | 3 refills | Status: AC
  Filled 2023-05-10 – 2023-05-16 (×2): qty 270, 90d supply, fill #0

## 2023-05-10 MED ORDER — PREDNISONE 20 MG PO TABS
40.0000 mg | ORAL_TABLET | Freq: Two times a day (BID) | ORAL | 0 refills | Status: AC
  Filled 2023-05-10: qty 120, 30d supply, fill #0

## 2023-05-10 NOTE — Progress Notes (Signed)
 Pharmacy Patient Advocate Encounter  Insurance verification completed.   The patient is insured through [facility]   Ran test claim for Jakafi . Co-pay is $0.  Patient has copay card with $9,450 worth of funds available until December. Copay card will automatically renew in January.   Patient will have copay card as long as he is enrolled in commercial plan.

## 2023-05-11 ENCOUNTER — Other Ambulatory Visit: Payer: Self-pay

## 2023-05-11 ENCOUNTER — Other Ambulatory Visit (HOSPITAL_COMMUNITY): Payer: Self-pay

## 2023-05-11 ENCOUNTER — Encounter (HOSPITAL_COMMUNITY): Payer: Self-pay

## 2023-05-11 MED ORDER — FLUCONAZOLE 200 MG PO TABS
400.0000 mg | ORAL_TABLET | Freq: Every day | ORAL | 3 refills | Status: DC
  Filled 2023-05-11: qty 180, 90d supply, fill #0

## 2023-05-11 NOTE — Progress Notes (Signed)
 Specialty Pharmacy Initiation Note   Marc Nielsen is a 53 y.o. male who will be followed by the specialty pharmacy service for RxSp Oncology    Review of administration, indication, effectiveness, safety, potential side effects, storage/disposable, and missed dose instructions occurred today for patient's specialty medication(s) Ruxolitinib  Phosphate (Jakafi )     Patient/Caregiver did not have any additional questions or concerns.   Patient's therapy is appropriate to: Initiate    Goals Addressed             This Visit's Progress    Stabilization of disease       Patient is initiating therapy. Patient will maintain adherence         We will follow up in 3 months.   Malachi Screws Specialty Pharmacist

## 2023-05-11 NOTE — Progress Notes (Signed)
 Specialty Pharmacy Initial Fill Coordination Note  Marc Nielsen is a 53 y.o. male contacted today regarding initial fill of specialty medication(s) Ruxolitinib  Phosphate (Jakafi )   Patient requested [name] at [facility] at [place] date: 05/12/23   Medication will be filled on 4/25.   Patient is aware of $0 copayment.

## 2023-05-12 ENCOUNTER — Other Ambulatory Visit: Payer: Self-pay

## 2023-05-12 ENCOUNTER — Other Ambulatory Visit (HOSPITAL_COMMUNITY): Payer: Self-pay

## 2023-05-16 ENCOUNTER — Other Ambulatory Visit (HOSPITAL_COMMUNITY): Payer: Self-pay

## 2023-05-16 ENCOUNTER — Other Ambulatory Visit: Payer: Self-pay

## 2023-05-16 ENCOUNTER — Other Ambulatory Visit: Payer: Self-pay | Admitting: Pharmacy Technician

## 2023-05-16 MED ORDER — JAKAFI 5 MG PO TABS
ORAL_TABLET | ORAL | 5 refills | Status: DC
  Filled 2023-05-16: qty 60, 30d supply, fill #0
  Filled 2023-06-07: qty 60, 30d supply, fill #1

## 2023-05-16 NOTE — Progress Notes (Signed)
 Specialty Pharmacy Refill Coordination Note  Marc Nielsen is a 53 y.o. male contacted today regarding refills of specialty medication(s) Ruxolitinib  Phosphate (Jakafi )  Spoke with Wife  Patient requested [name] at [facility] at [place] date: 05/17/23   Medication will be filled on 05/17/23.

## 2023-05-17 ENCOUNTER — Other Ambulatory Visit (HOSPITAL_COMMUNITY): Payer: Self-pay

## 2023-05-17 ENCOUNTER — Other Ambulatory Visit: Payer: Self-pay

## 2023-05-18 ENCOUNTER — Other Ambulatory Visit: Payer: Self-pay

## 2023-05-19 ENCOUNTER — Other Ambulatory Visit (HOSPITAL_COMMUNITY): Payer: Self-pay

## 2023-05-22 ENCOUNTER — Other Ambulatory Visit: Payer: Self-pay

## 2023-05-22 ENCOUNTER — Other Ambulatory Visit (HOSPITAL_COMMUNITY): Payer: Self-pay

## 2023-05-24 DIAGNOSIS — D89811 Chronic graft-versus-host disease: Secondary | ICD-10-CM | POA: Diagnosis not present

## 2023-05-24 DIAGNOSIS — C9201 Acute myeloblastic leukemia, in remission: Secondary | ICD-10-CM | POA: Diagnosis not present

## 2023-05-24 DIAGNOSIS — Z9484 Stem cells transplant status: Secondary | ICD-10-CM | POA: Diagnosis not present

## 2023-05-24 DIAGNOSIS — Z9481 Bone marrow transplant status: Secondary | ICD-10-CM | POA: Diagnosis not present

## 2023-05-29 ENCOUNTER — Other Ambulatory Visit: Payer: Self-pay

## 2023-05-29 ENCOUNTER — Other Ambulatory Visit (HOSPITAL_COMMUNITY): Payer: Self-pay

## 2023-05-31 DIAGNOSIS — C9201 Acute myeloblastic leukemia, in remission: Secondary | ICD-10-CM | POA: Diagnosis not present

## 2023-05-31 DIAGNOSIS — Z9481 Bone marrow transplant status: Secondary | ICD-10-CM | POA: Diagnosis not present

## 2023-05-31 DIAGNOSIS — D89811 Chronic graft-versus-host disease: Secondary | ICD-10-CM | POA: Diagnosis not present

## 2023-05-31 DIAGNOSIS — Z9484 Stem cells transplant status: Secondary | ICD-10-CM | POA: Diagnosis not present

## 2023-06-05 ENCOUNTER — Other Ambulatory Visit (HOSPITAL_COMMUNITY): Payer: Self-pay

## 2023-06-05 ENCOUNTER — Other Ambulatory Visit: Payer: Self-pay

## 2023-06-07 ENCOUNTER — Other Ambulatory Visit: Payer: Self-pay

## 2023-06-07 ENCOUNTER — Other Ambulatory Visit: Payer: Self-pay | Admitting: Pharmacy Technician

## 2023-06-07 DIAGNOSIS — Z9484 Stem cells transplant status: Secondary | ICD-10-CM | POA: Diagnosis not present

## 2023-06-07 DIAGNOSIS — R350 Frequency of micturition: Secondary | ICD-10-CM | POA: Diagnosis not present

## 2023-06-07 DIAGNOSIS — Z9481 Bone marrow transplant status: Secondary | ICD-10-CM | POA: Diagnosis not present

## 2023-06-07 DIAGNOSIS — C9201 Acute myeloblastic leukemia, in remission: Secondary | ICD-10-CM | POA: Diagnosis not present

## 2023-06-07 DIAGNOSIS — D89811 Chronic graft-versus-host disease: Secondary | ICD-10-CM | POA: Diagnosis not present

## 2023-06-07 NOTE — Progress Notes (Signed)
 Specialty Pharmacy Refill Coordination Note  Marc Nielsen is a 53 y.o. male contacted today regarding refills of specialty medication(s) Ruxolitinib  Phosphate (Jakafi )  Spoke with Wife  Patient requested [name] at [facility] at [place] date: 06/14/23   Medication will be filled on 06/13/23.

## 2023-06-13 ENCOUNTER — Other Ambulatory Visit: Payer: Self-pay

## 2023-06-14 DIAGNOSIS — Z9484 Stem cells transplant status: Secondary | ICD-10-CM | POA: Diagnosis not present

## 2023-06-14 DIAGNOSIS — C9201 Acute myeloblastic leukemia, in remission: Secondary | ICD-10-CM | POA: Diagnosis not present

## 2023-06-14 DIAGNOSIS — D89811 Chronic graft-versus-host disease: Secondary | ICD-10-CM | POA: Diagnosis not present

## 2023-06-14 DIAGNOSIS — D696 Thrombocytopenia, unspecified: Secondary | ICD-10-CM | POA: Diagnosis not present

## 2023-06-15 ENCOUNTER — Other Ambulatory Visit (HOSPITAL_COMMUNITY): Payer: Self-pay

## 2023-06-19 ENCOUNTER — Other Ambulatory Visit (HOSPITAL_COMMUNITY): Payer: Self-pay

## 2023-06-19 DIAGNOSIS — C9201 Acute myeloblastic leukemia, in remission: Secondary | ICD-10-CM | POA: Diagnosis not present

## 2023-06-19 DIAGNOSIS — D89811 Chronic graft-versus-host disease: Secondary | ICD-10-CM | POA: Diagnosis not present

## 2023-06-19 DIAGNOSIS — Z79624 Long term (current) use of inhibitors of nucleotide synthesis: Secondary | ICD-10-CM | POA: Diagnosis not present

## 2023-06-19 DIAGNOSIS — D696 Thrombocytopenia, unspecified: Secondary | ICD-10-CM | POA: Diagnosis not present

## 2023-06-19 DIAGNOSIS — Z9484 Stem cells transplant status: Secondary | ICD-10-CM | POA: Diagnosis not present

## 2023-06-19 DIAGNOSIS — Z79899 Other long term (current) drug therapy: Secondary | ICD-10-CM | POA: Diagnosis not present

## 2023-06-19 DIAGNOSIS — N281 Cyst of kidney, acquired: Secondary | ICD-10-CM | POA: Diagnosis not present

## 2023-06-19 DIAGNOSIS — K7689 Other specified diseases of liver: Secondary | ICD-10-CM | POA: Diagnosis not present

## 2023-06-19 MED ORDER — PREDNISONE 10 MG PO TABS
10.0000 mg | ORAL_TABLET | Freq: Every morning | ORAL | 0 refills | Status: AC
  Filled 2023-06-19: qty 30, 30d supply, fill #0

## 2023-06-20 ENCOUNTER — Other Ambulatory Visit: Payer: Self-pay

## 2023-06-20 ENCOUNTER — Other Ambulatory Visit (HOSPITAL_COMMUNITY): Payer: Self-pay

## 2023-06-20 MED ORDER — JAKAFI 5 MG PO TABS
5.0000 mg | ORAL_TABLET | Freq: Every day | ORAL | 0 refills | Status: DC
  Filled 2023-06-20 – 2023-08-02 (×3): qty 30, 30d supply, fill #0

## 2023-06-22 ENCOUNTER — Other Ambulatory Visit (HOSPITAL_COMMUNITY): Payer: Self-pay

## 2023-06-22 MED ORDER — DOXAZOSIN MESYLATE 4 MG PO TABS
4.0000 mg | ORAL_TABLET | Freq: Every day | ORAL | 3 refills | Status: DC
  Filled 2023-06-22: qty 30, 30d supply, fill #0
  Filled 2023-07-19: qty 30, 30d supply, fill #1
  Filled 2023-08-21: qty 30, 30d supply, fill #2
  Filled 2023-09-15: qty 30, 30d supply, fill #3

## 2023-06-27 ENCOUNTER — Other Ambulatory Visit (HOSPITAL_COMMUNITY): Payer: Self-pay

## 2023-06-28 DIAGNOSIS — K8050 Calculus of bile duct without cholangitis or cholecystitis without obstruction: Secondary | ICD-10-CM | POA: Diagnosis not present

## 2023-06-28 DIAGNOSIS — D89811 Chronic graft-versus-host disease: Secondary | ICD-10-CM | POA: Diagnosis not present

## 2023-06-28 DIAGNOSIS — Z9481 Bone marrow transplant status: Secondary | ICD-10-CM | POA: Diagnosis not present

## 2023-06-28 DIAGNOSIS — T380X5A Adverse effect of glucocorticoids and synthetic analogues, initial encounter: Secondary | ICD-10-CM | POA: Diagnosis not present

## 2023-06-28 DIAGNOSIS — C9201 Acute myeloblastic leukemia, in remission: Secondary | ICD-10-CM | POA: Diagnosis not present

## 2023-06-28 DIAGNOSIS — R109 Unspecified abdominal pain: Secondary | ICD-10-CM | POA: Diagnosis not present

## 2023-06-28 DIAGNOSIS — Z9484 Stem cells transplant status: Secondary | ICD-10-CM | POA: Diagnosis not present

## 2023-06-28 DIAGNOSIS — R739 Hyperglycemia, unspecified: Secondary | ICD-10-CM | POA: Diagnosis not present

## 2023-07-04 ENCOUNTER — Other Ambulatory Visit (HOSPITAL_COMMUNITY): Payer: Self-pay

## 2023-07-05 DIAGNOSIS — R109 Unspecified abdominal pain: Secondary | ICD-10-CM | POA: Diagnosis not present

## 2023-07-05 DIAGNOSIS — D89811 Chronic graft-versus-host disease: Secondary | ICD-10-CM | POA: Diagnosis not present

## 2023-07-05 DIAGNOSIS — Z9481 Bone marrow transplant status: Secondary | ICD-10-CM | POA: Diagnosis not present

## 2023-07-05 DIAGNOSIS — C9201 Acute myeloblastic leukemia, in remission: Secondary | ICD-10-CM | POA: Diagnosis not present

## 2023-07-05 DIAGNOSIS — Z9484 Stem cells transplant status: Secondary | ICD-10-CM | POA: Diagnosis not present

## 2023-07-07 ENCOUNTER — Other Ambulatory Visit: Payer: Self-pay

## 2023-07-10 ENCOUNTER — Other Ambulatory Visit (HOSPITAL_COMMUNITY): Payer: Self-pay

## 2023-07-10 ENCOUNTER — Other Ambulatory Visit: Payer: Self-pay

## 2023-07-12 ENCOUNTER — Other Ambulatory Visit (HOSPITAL_COMMUNITY): Payer: Self-pay

## 2023-07-12 DIAGNOSIS — D89811 Chronic graft-versus-host disease: Secondary | ICD-10-CM | POA: Diagnosis not present

## 2023-07-12 DIAGNOSIS — C9201 Acute myeloblastic leukemia, in remission: Secondary | ICD-10-CM | POA: Diagnosis not present

## 2023-07-12 DIAGNOSIS — Z9484 Stem cells transplant status: Secondary | ICD-10-CM | POA: Diagnosis not present

## 2023-07-12 DIAGNOSIS — Z9481 Bone marrow transplant status: Secondary | ICD-10-CM | POA: Diagnosis not present

## 2023-07-19 ENCOUNTER — Other Ambulatory Visit (HOSPITAL_COMMUNITY): Payer: Self-pay

## 2023-07-19 DIAGNOSIS — Z9484 Stem cells transplant status: Secondary | ICD-10-CM | POA: Diagnosis not present

## 2023-07-19 DIAGNOSIS — D89811 Chronic graft-versus-host disease: Secondary | ICD-10-CM | POA: Diagnosis not present

## 2023-07-19 DIAGNOSIS — C9201 Acute myeloblastic leukemia, in remission: Secondary | ICD-10-CM | POA: Diagnosis not present

## 2023-07-19 MED ORDER — POTASSIUM CHLORIDE CRYS ER 20 MEQ PO TBCR
40.0000 meq | EXTENDED_RELEASE_TABLET | Freq: Every day | ORAL | 0 refills | Status: DC
  Filled 2023-07-19: qty 21, 11d supply, fill #0
  Filled 2023-07-19: qty 9, 4d supply, fill #0
  Filled 2023-07-19: qty 39, 19d supply, fill #0

## 2023-07-19 MED ORDER — MG PLUS PROTEIN 133 MG PO TABS
266.0000 mg | ORAL_TABLET | Freq: Two times a day (BID) | ORAL | 0 refills | Status: AC
  Filled 2023-07-19: qty 100, 25d supply, fill #0
  Filled 2023-08-15: qty 100, 25d supply, fill #1
  Filled 2023-09-15: qty 100, 25d supply, fill #2

## 2023-07-20 ENCOUNTER — Other Ambulatory Visit (HOSPITAL_COMMUNITY): Payer: Self-pay

## 2023-07-24 ENCOUNTER — Other Ambulatory Visit (HOSPITAL_COMMUNITY): Payer: Self-pay

## 2023-07-26 DIAGNOSIS — Z9481 Bone marrow transplant status: Secondary | ICD-10-CM | POA: Diagnosis not present

## 2023-07-26 DIAGNOSIS — Z9484 Stem cells transplant status: Secondary | ICD-10-CM | POA: Diagnosis not present

## 2023-07-26 DIAGNOSIS — C9201 Acute myeloblastic leukemia, in remission: Secondary | ICD-10-CM | POA: Diagnosis not present

## 2023-07-26 DIAGNOSIS — D89811 Chronic graft-versus-host disease: Secondary | ICD-10-CM | POA: Diagnosis not present

## 2023-07-27 ENCOUNTER — Other Ambulatory Visit: Payer: Self-pay

## 2023-07-28 ENCOUNTER — Other Ambulatory Visit (HOSPITAL_COMMUNITY): Payer: Self-pay

## 2023-08-01 ENCOUNTER — Other Ambulatory Visit (HOSPITAL_COMMUNITY): Payer: Self-pay

## 2023-08-02 ENCOUNTER — Other Ambulatory Visit: Payer: Self-pay

## 2023-08-02 NOTE — Progress Notes (Signed)
 Specialty Pharmacy Refill Coordination Note  Marc Nielsen is a 53 y.o. male contacted today regarding refills of specialty medication(s) Ruxolitinib  Phosphate (Jakafi )   Patient requested [name] at [facility] at [place] date: 08/04/23   Medication will be filled on 08/03/23.

## 2023-08-02 NOTE — Progress Notes (Signed)
 Specialty Pharmacy Ongoing Clinical Assessment Note  Marc Nielsen is a 53 y.o. male who is being followed by the specialty pharmacy service for RxSp Oncology   Patient's specialty medication(s) reviewed today: Ruxolitinib  Phosphate (Jakafi )   Missed doses in the last 4 weeks: 0   Patient/Caregiver did not have any additional questions or concerns.   Therapeutic benefit summary: Patient is achieving benefit   Adverse events/side effects summary: No adverse events/side effects   Patient's therapy is appropriate to: Continue    Goals Addressed             This Visit's Progress    Stabilization of disease       Patient is on track. Patient will maintain adherence          Follow up: 3 months  Nicolemarie Wooley M Brian Zeitlin Specialty Pharmacist

## 2023-08-08 ENCOUNTER — Other Ambulatory Visit (HOSPITAL_COMMUNITY): Payer: Self-pay

## 2023-08-08 MED ORDER — NYSTATIN 100000 UNIT/GM EX POWD
1.0000 | Freq: Two times a day (BID) | CUTANEOUS | 1 refills | Status: DC
  Filled 2023-08-08: qty 30, 15d supply, fill #0
  Filled 2023-08-21 (×2): qty 30, 15d supply, fill #1

## 2023-08-15 ENCOUNTER — Other Ambulatory Visit (HOSPITAL_COMMUNITY): Payer: Self-pay

## 2023-08-15 ENCOUNTER — Other Ambulatory Visit: Payer: Self-pay

## 2023-08-15 MED ORDER — POTASSIUM CHLORIDE CRYS ER 20 MEQ PO TBCR
40.0000 meq | EXTENDED_RELEASE_TABLET | Freq: Every day | ORAL | 1 refills | Status: DC
  Filled 2023-08-15 – 2023-09-12 (×2): qty 60, 30d supply, fill #0

## 2023-08-16 ENCOUNTER — Other Ambulatory Visit (HOSPITAL_COMMUNITY): Payer: Self-pay

## 2023-08-21 ENCOUNTER — Other Ambulatory Visit: Payer: Self-pay

## 2023-08-21 ENCOUNTER — Other Ambulatory Visit (HOSPITAL_COMMUNITY): Payer: Self-pay

## 2023-08-23 ENCOUNTER — Other Ambulatory Visit (HOSPITAL_COMMUNITY): Payer: Self-pay

## 2023-08-23 DIAGNOSIS — R531 Weakness: Secondary | ICD-10-CM | POA: Diagnosis not present

## 2023-08-23 DIAGNOSIS — C9201 Acute myeloblastic leukemia, in remission: Secondary | ICD-10-CM | POA: Diagnosis not present

## 2023-08-23 DIAGNOSIS — Z9481 Bone marrow transplant status: Secondary | ICD-10-CM | POA: Diagnosis not present

## 2023-08-23 DIAGNOSIS — D89811 Chronic graft-versus-host disease: Secondary | ICD-10-CM | POA: Diagnosis not present

## 2023-08-23 DIAGNOSIS — Z9484 Stem cells transplant status: Secondary | ICD-10-CM | POA: Diagnosis not present

## 2023-08-23 MED ORDER — SILVER SULFADIAZINE 1 % EX CREA
1.0000 | TOPICAL_CREAM | Freq: Two times a day (BID) | CUTANEOUS | 0 refills | Status: DC
  Filled 2023-08-23: qty 50, 25d supply, fill #0

## 2023-08-24 ENCOUNTER — Other Ambulatory Visit (HOSPITAL_COMMUNITY): Payer: Self-pay

## 2023-08-24 MED ORDER — SHINGRIX 50 MCG/0.5ML IM SUSR
0.5000 mL | Freq: Once | INTRAMUSCULAR | 0 refills | Status: AC
  Filled 2023-08-24 – 2023-12-11 (×2): qty 0.5, 1d supply, fill #0

## 2023-08-24 MED ORDER — SHINGRIX 50 MCG/0.5ML IM SUSR
0.5000 mL | Freq: Once | INTRAMUSCULAR | 0 refills | Status: AC
  Filled 2023-10-05: qty 0.5, 1d supply, fill #0

## 2023-08-29 ENCOUNTER — Other Ambulatory Visit (HOSPITAL_COMMUNITY): Payer: Self-pay

## 2023-09-01 ENCOUNTER — Other Ambulatory Visit (HOSPITAL_COMMUNITY): Payer: Self-pay

## 2023-09-01 ENCOUNTER — Other Ambulatory Visit: Payer: Self-pay

## 2023-09-01 MED ORDER — JAKAFI 5 MG PO TABS
ORAL_TABLET | ORAL | 0 refills | Status: DC
  Filled 2023-09-01: qty 30, 30d supply, fill #0

## 2023-09-01 NOTE — Progress Notes (Signed)
 Specialty Pharmacy Refill Coordination Note  Marc Nielsen is a 53 y.o. male patients wife was contacted today regarding refills of specialty medication(s) Ruxolitinib Phosphate (Jakafi)   Patient requested [name] at [facility] at [place] date: 09/06/23   Medication will be filled on 09/05/23.   This fill date is pending response to refill request from provider. Patient is aware and if they have not received fill by intended date they must follow up with pharmacy.

## 2023-09-05 ENCOUNTER — Other Ambulatory Visit (HOSPITAL_COMMUNITY): Payer: Self-pay

## 2023-09-05 ENCOUNTER — Other Ambulatory Visit: Payer: Self-pay

## 2023-09-05 DIAGNOSIS — R1013 Epigastric pain: Secondary | ICD-10-CM | POA: Diagnosis not present

## 2023-09-05 DIAGNOSIS — Z4889 Encounter for other specified surgical aftercare: Secondary | ICD-10-CM | POA: Diagnosis not present

## 2023-09-05 DIAGNOSIS — R1011 Right upper quadrant pain: Secondary | ICD-10-CM | POA: Diagnosis not present

## 2023-09-05 MED ORDER — METOPROLOL TARTRATE 25 MG PO TABS
12.5000 mg | ORAL_TABLET | Freq: Two times a day (BID) | ORAL | 3 refills | Status: DC
  Filled 2023-09-05: qty 30, 30d supply, fill #0
  Filled 2023-10-02: qty 30, 30d supply, fill #1
  Filled 2023-11-16: qty 30, 30d supply, fill #2
  Filled 2023-12-25: qty 30, 30d supply, fill #3

## 2023-09-05 MED ORDER — NYSTATIN 100000 UNIT/GM EX POWD
1.0000 | Freq: Two times a day (BID) | CUTANEOUS | 1 refills | Status: DC
  Filled 2023-09-05: qty 30, 15d supply, fill #0

## 2023-09-06 DIAGNOSIS — Z9481 Bone marrow transplant status: Secondary | ICD-10-CM | POA: Diagnosis not present

## 2023-09-06 DIAGNOSIS — C9201 Acute myeloblastic leukemia, in remission: Secondary | ICD-10-CM | POA: Diagnosis not present

## 2023-09-06 DIAGNOSIS — Z9484 Stem cells transplant status: Secondary | ICD-10-CM | POA: Diagnosis not present

## 2023-09-06 DIAGNOSIS — D89811 Chronic graft-versus-host disease: Secondary | ICD-10-CM | POA: Diagnosis not present

## 2023-09-12 ENCOUNTER — Other Ambulatory Visit (HOSPITAL_COMMUNITY): Payer: Self-pay

## 2023-09-15 ENCOUNTER — Other Ambulatory Visit (HOSPITAL_COMMUNITY): Payer: Self-pay

## 2023-09-15 MED ORDER — ACYCLOVIR 800 MG PO TABS
800.0000 mg | ORAL_TABLET | Freq: Two times a day (BID) | ORAL | 3 refills | Status: DC
  Filled 2023-09-15: qty 180, 90d supply, fill #0

## 2023-09-19 ENCOUNTER — Other Ambulatory Visit (HOSPITAL_COMMUNITY): Payer: Self-pay

## 2023-09-21 ENCOUNTER — Other Ambulatory Visit: Payer: Self-pay

## 2023-09-21 ENCOUNTER — Other Ambulatory Visit (HOSPITAL_COMMUNITY): Payer: Self-pay

## 2023-09-21 DIAGNOSIS — L299 Pruritus, unspecified: Secondary | ICD-10-CM | POA: Diagnosis not present

## 2023-09-21 DIAGNOSIS — D89811 Chronic graft-versus-host disease: Secondary | ICD-10-CM | POA: Diagnosis not present

## 2023-09-21 DIAGNOSIS — L853 Xerosis cutis: Secondary | ICD-10-CM | POA: Diagnosis not present

## 2023-09-21 DIAGNOSIS — R238 Other skin changes: Secondary | ICD-10-CM | POA: Diagnosis not present

## 2023-09-21 MED ORDER — FOLIC ACID 1 MG PO TABS
1.0000 mg | ORAL_TABLET | Freq: Every day | ORAL | 3 refills | Status: DC
  Filled 2023-09-21: qty 30, 30d supply, fill #0

## 2023-09-21 MED ORDER — HYDROCORTISONE 2.5 % EX CREA
1.0000 | TOPICAL_CREAM | Freq: Two times a day (BID) | CUTANEOUS | 2 refills | Status: DC
  Filled 2023-09-21: qty 30, 10d supply, fill #0

## 2023-09-27 DIAGNOSIS — L600 Ingrowing nail: Secondary | ICD-10-CM | POA: Diagnosis not present

## 2023-09-28 DIAGNOSIS — D6949 Other primary thrombocytopenia: Secondary | ICD-10-CM | POA: Diagnosis not present

## 2023-09-28 DIAGNOSIS — D89811 Chronic graft-versus-host disease: Secondary | ICD-10-CM | POA: Diagnosis not present

## 2023-09-28 DIAGNOSIS — D539 Nutritional anemia, unspecified: Secondary | ICD-10-CM | POA: Diagnosis not present

## 2023-09-28 DIAGNOSIS — R002 Palpitations: Secondary | ICD-10-CM | POA: Diagnosis not present

## 2023-09-28 DIAGNOSIS — Z9484 Stem cells transplant status: Secondary | ICD-10-CM | POA: Diagnosis not present

## 2023-09-28 DIAGNOSIS — Z9481 Bone marrow transplant status: Secondary | ICD-10-CM | POA: Diagnosis not present

## 2023-09-28 DIAGNOSIS — B009 Herpesviral infection, unspecified: Secondary | ICD-10-CM | POA: Diagnosis not present

## 2023-09-28 DIAGNOSIS — I493 Ventricular premature depolarization: Secondary | ICD-10-CM | POA: Diagnosis not present

## 2023-09-28 DIAGNOSIS — C9201 Acute myeloblastic leukemia, in remission: Secondary | ICD-10-CM | POA: Diagnosis not present

## 2023-09-28 DIAGNOSIS — Z79624 Long term (current) use of inhibitors of nucleotide synthesis: Secondary | ICD-10-CM | POA: Diagnosis not present

## 2023-10-02 ENCOUNTER — Other Ambulatory Visit (HOSPITAL_COMMUNITY): Payer: Self-pay

## 2023-10-02 ENCOUNTER — Encounter (HOSPITAL_COMMUNITY): Payer: Self-pay

## 2023-10-04 DIAGNOSIS — C9201 Acute myeloblastic leukemia, in remission: Secondary | ICD-10-CM | POA: Diagnosis not present

## 2023-10-04 DIAGNOSIS — R2681 Unsteadiness on feet: Secondary | ICD-10-CM | POA: Diagnosis not present

## 2023-10-04 DIAGNOSIS — D89811 Chronic graft-versus-host disease: Secondary | ICD-10-CM | POA: Diagnosis not present

## 2023-10-04 DIAGNOSIS — R531 Weakness: Secondary | ICD-10-CM | POA: Diagnosis not present

## 2023-10-04 DIAGNOSIS — N1832 Chronic kidney disease, stage 3b: Secondary | ICD-10-CM | POA: Diagnosis not present

## 2023-10-04 DIAGNOSIS — Z9481 Bone marrow transplant status: Secondary | ICD-10-CM | POA: Diagnosis not present

## 2023-10-04 DIAGNOSIS — Z9484 Stem cells transplant status: Secondary | ICD-10-CM | POA: Diagnosis not present

## 2023-10-05 ENCOUNTER — Other Ambulatory Visit (HOSPITAL_COMMUNITY): Payer: Self-pay

## 2023-10-05 ENCOUNTER — Other Ambulatory Visit: Payer: Self-pay

## 2023-10-05 MED ORDER — JAKAFI 5 MG PO TABS
ORAL_TABLET | ORAL | 1 refills | Status: DC
  Filled 2023-10-09 – 2023-10-19 (×2): qty 30, 30d supply, fill #0
  Filled 2023-11-14: qty 30, 30d supply, fill #1

## 2023-10-09 ENCOUNTER — Other Ambulatory Visit (HOSPITAL_COMMUNITY): Payer: Self-pay

## 2023-10-09 ENCOUNTER — Other Ambulatory Visit: Payer: Self-pay

## 2023-10-09 NOTE — Progress Notes (Signed)
 Specialty Pharmacy Ongoing Clinical Assessment Note  Spoke to patient's wife, Marc Nielsen is a 53 y.o. male who is being followed by the specialty pharmacy service for RxSp Oncology   Patient's specialty medication(s) reviewed today: Ruxolitinib  Phosphate (Jakafi )   Missed doses in the last 4 weeks: 0   Patient/Caregiver did not have any additional questions or concerns.   Therapeutic benefit summary: Patient is achieving benefit   Adverse events/side effects summary: No adverse events/side effects   Patient's therapy is appropriate to: Continue    Goals Addressed             This Visit's Progress    Stabilization of disease   On track    Patient is on track. Patient will maintain adherence          Follow up: 3 months  [facility] Specialty Pharmacist

## 2023-10-09 NOTE — Progress Notes (Signed)
 Specialty Pharmacy Refill Coordination Note  Marc Nielsen is a 53 y.o. male contacted today regarding refills of specialty medication(s) Ruxolitinib  Phosphate (Jakafi )   Patient requested [name] at [facility] at [place] date: 10/13/23   Medication will be filled on 10/12/23.

## 2023-10-10 ENCOUNTER — Other Ambulatory Visit (HOSPITAL_COMMUNITY): Payer: Self-pay

## 2023-10-10 DIAGNOSIS — R002 Palpitations: Secondary | ICD-10-CM | POA: Diagnosis not present

## 2023-10-11 ENCOUNTER — Other Ambulatory Visit: Payer: Self-pay

## 2023-10-12 ENCOUNTER — Other Ambulatory Visit (HOSPITAL_COMMUNITY): Payer: Self-pay

## 2023-10-13 ENCOUNTER — Other Ambulatory Visit (HOSPITAL_COMMUNITY): Payer: Self-pay

## 2023-10-13 DIAGNOSIS — N1832 Chronic kidney disease, stage 3b: Secondary | ICD-10-CM | POA: Diagnosis not present

## 2023-10-13 DIAGNOSIS — Z9481 Bone marrow transplant status: Secondary | ICD-10-CM | POA: Diagnosis not present

## 2023-10-13 DIAGNOSIS — R531 Weakness: Secondary | ICD-10-CM | POA: Diagnosis not present

## 2023-10-13 DIAGNOSIS — C9201 Acute myeloblastic leukemia, in remission: Secondary | ICD-10-CM | POA: Diagnosis not present

## 2023-10-13 DIAGNOSIS — R2681 Unsteadiness on feet: Secondary | ICD-10-CM | POA: Diagnosis not present

## 2023-10-13 DIAGNOSIS — D89811 Chronic graft-versus-host disease: Secondary | ICD-10-CM | POA: Diagnosis not present

## 2023-10-13 DIAGNOSIS — Z9484 Stem cells transplant status: Secondary | ICD-10-CM | POA: Diagnosis not present

## 2023-10-15 DIAGNOSIS — I493 Ventricular premature depolarization: Secondary | ICD-10-CM | POA: Diagnosis not present

## 2023-10-16 ENCOUNTER — Other Ambulatory Visit (HOSPITAL_COMMUNITY): Payer: Self-pay

## 2023-10-16 ENCOUNTER — Encounter (HOSPITAL_COMMUNITY): Payer: Self-pay

## 2023-10-18 ENCOUNTER — Other Ambulatory Visit (HOSPITAL_COMMUNITY): Payer: Self-pay

## 2023-10-19 ENCOUNTER — Encounter (HOSPITAL_COMMUNITY): Payer: Self-pay

## 2023-10-19 ENCOUNTER — Other Ambulatory Visit: Payer: Self-pay

## 2023-10-19 ENCOUNTER — Other Ambulatory Visit (HOSPITAL_COMMUNITY): Payer: Self-pay

## 2023-10-19 MED ORDER — POSACONAZOLE 100 MG PO TBEC
300.0000 mg | DELAYED_RELEASE_TABLET | Freq: Every day | ORAL | 3 refills | Status: DC
  Filled 2023-10-19: qty 270, 90d supply, fill #0

## 2023-10-19 MED ORDER — POTASSIUM CHLORIDE CRYS ER 20 MEQ PO TBCR
40.0000 meq | EXTENDED_RELEASE_TABLET | Freq: Every day | ORAL | 1 refills | Status: DC
  Filled 2023-10-19: qty 60, 30d supply, fill #0
  Filled 2023-11-16: qty 60, 30d supply, fill #1

## 2023-10-19 MED ORDER — PANTOPRAZOLE SODIUM 40 MG PO TBEC
40.0000 mg | DELAYED_RELEASE_TABLET | Freq: Every day | ORAL | 0 refills | Status: DC
  Filled 2023-10-19: qty 90, 90d supply, fill #0

## 2023-10-20 ENCOUNTER — Other Ambulatory Visit (HOSPITAL_COMMUNITY): Payer: Self-pay

## 2023-10-23 ENCOUNTER — Other Ambulatory Visit (HOSPITAL_COMMUNITY): Payer: Self-pay

## 2023-10-25 ENCOUNTER — Other Ambulatory Visit (HOSPITAL_COMMUNITY): Payer: Self-pay

## 2023-10-25 ENCOUNTER — Other Ambulatory Visit: Payer: Self-pay

## 2023-10-25 MED ORDER — DOXAZOSIN MESYLATE 4 MG PO TABS
4.0000 mg | ORAL_TABLET | Freq: Every day | ORAL | 2 refills | Status: AC
  Filled 2023-10-25: qty 30, 30d supply, fill #0
  Filled 2023-11-29 (×2): qty 30, 30d supply, fill #1
  Filled 2023-12-25: qty 30, 30d supply, fill #2

## 2023-10-30 ENCOUNTER — Other Ambulatory Visit (HOSPITAL_COMMUNITY): Payer: Self-pay

## 2023-11-01 DIAGNOSIS — D89811 Chronic graft-versus-host disease: Secondary | ICD-10-CM | POA: Diagnosis not present

## 2023-11-01 DIAGNOSIS — Z9484 Stem cells transplant status: Secondary | ICD-10-CM | POA: Diagnosis not present

## 2023-11-01 DIAGNOSIS — N1832 Chronic kidney disease, stage 3b: Secondary | ICD-10-CM | POA: Diagnosis not present

## 2023-11-01 DIAGNOSIS — R531 Weakness: Secondary | ICD-10-CM | POA: Diagnosis not present

## 2023-11-01 DIAGNOSIS — Z9481 Bone marrow transplant status: Secondary | ICD-10-CM | POA: Diagnosis not present

## 2023-11-01 DIAGNOSIS — R2681 Unsteadiness on feet: Secondary | ICD-10-CM | POA: Diagnosis not present

## 2023-11-01 DIAGNOSIS — C9201 Acute myeloblastic leukemia, in remission: Secondary | ICD-10-CM | POA: Diagnosis not present

## 2023-11-06 ENCOUNTER — Other Ambulatory Visit (HOSPITAL_BASED_OUTPATIENT_CLINIC_OR_DEPARTMENT_OTHER): Payer: Self-pay

## 2023-11-06 DIAGNOSIS — R0789 Other chest pain: Secondary | ICD-10-CM | POA: Diagnosis not present

## 2023-11-06 DIAGNOSIS — C9201 Acute myeloblastic leukemia, in remission: Secondary | ICD-10-CM | POA: Diagnosis not present

## 2023-11-06 DIAGNOSIS — D89811 Chronic graft-versus-host disease: Secondary | ICD-10-CM | POA: Diagnosis not present

## 2023-11-06 DIAGNOSIS — R0781 Pleurodynia: Secondary | ICD-10-CM | POA: Diagnosis not present

## 2023-11-06 DIAGNOSIS — Z9481 Bone marrow transplant status: Secondary | ICD-10-CM | POA: Diagnosis not present

## 2023-11-07 DIAGNOSIS — R0781 Pleurodynia: Secondary | ICD-10-CM | POA: Diagnosis not present

## 2023-11-07 DIAGNOSIS — C9201 Acute myeloblastic leukemia, in remission: Secondary | ICD-10-CM | POA: Diagnosis not present

## 2023-11-07 DIAGNOSIS — Z9484 Stem cells transplant status: Secondary | ICD-10-CM | POA: Diagnosis not present

## 2023-11-07 DIAGNOSIS — R0789 Other chest pain: Secondary | ICD-10-CM | POA: Diagnosis not present

## 2023-11-07 DIAGNOSIS — R509 Fever, unspecified: Secondary | ICD-10-CM | POA: Diagnosis not present

## 2023-11-10 DIAGNOSIS — R109 Unspecified abdominal pain: Secondary | ICD-10-CM | POA: Diagnosis not present

## 2023-11-10 DIAGNOSIS — Z9481 Bone marrow transplant status: Secondary | ICD-10-CM | POA: Diagnosis not present

## 2023-11-10 DIAGNOSIS — C9200 Acute myeloblastic leukemia, not having achieved remission: Secondary | ICD-10-CM | POA: Diagnosis not present

## 2023-11-10 DIAGNOSIS — C9201 Acute myeloblastic leukemia, in remission: Secondary | ICD-10-CM | POA: Diagnosis not present

## 2023-11-10 DIAGNOSIS — R1011 Right upper quadrant pain: Secondary | ICD-10-CM | POA: Diagnosis not present

## 2023-11-10 DIAGNOSIS — Z9484 Stem cells transplant status: Secondary | ICD-10-CM | POA: Diagnosis not present

## 2023-11-10 DIAGNOSIS — R0789 Other chest pain: Secondary | ICD-10-CM | POA: Diagnosis not present

## 2023-11-10 DIAGNOSIS — R509 Fever, unspecified: Secondary | ICD-10-CM | POA: Diagnosis not present

## 2023-11-11 DIAGNOSIS — R100 Acute abdomen: Secondary | ICD-10-CM | POA: Diagnosis not present

## 2023-11-11 DIAGNOSIS — R509 Fever, unspecified: Secondary | ICD-10-CM | POA: Diagnosis not present

## 2023-11-14 ENCOUNTER — Other Ambulatory Visit: Payer: Self-pay

## 2023-11-15 ENCOUNTER — Other Ambulatory Visit: Payer: Self-pay

## 2023-11-15 ENCOUNTER — Other Ambulatory Visit (HOSPITAL_COMMUNITY): Payer: Self-pay

## 2023-11-15 DIAGNOSIS — R000 Tachycardia, unspecified: Secondary | ICD-10-CM | POA: Diagnosis not present

## 2023-11-15 DIAGNOSIS — I2583 Coronary atherosclerosis due to lipid rich plaque: Secondary | ICD-10-CM | POA: Diagnosis not present

## 2023-11-15 DIAGNOSIS — Z951 Presence of aortocoronary bypass graft: Secondary | ICD-10-CM | POA: Diagnosis not present

## 2023-11-15 DIAGNOSIS — E785 Hyperlipidemia, unspecified: Secondary | ICD-10-CM | POA: Diagnosis not present

## 2023-11-15 DIAGNOSIS — I10 Essential (primary) hypertension: Secondary | ICD-10-CM | POA: Diagnosis not present

## 2023-11-15 DIAGNOSIS — Z79899 Other long term (current) drug therapy: Secondary | ICD-10-CM | POA: Diagnosis not present

## 2023-11-15 DIAGNOSIS — F17290 Nicotine dependence, other tobacco product, uncomplicated: Secondary | ICD-10-CM | POA: Diagnosis not present

## 2023-11-15 DIAGNOSIS — I252 Old myocardial infarction: Secondary | ICD-10-CM | POA: Diagnosis not present

## 2023-11-15 DIAGNOSIS — Z7902 Long term (current) use of antithrombotics/antiplatelets: Secondary | ICD-10-CM | POA: Diagnosis not present

## 2023-11-15 DIAGNOSIS — I2510 Atherosclerotic heart disease of native coronary artery without angina pectoris: Secondary | ICD-10-CM | POA: Diagnosis not present

## 2023-11-15 NOTE — Progress Notes (Signed)
 Per 11/15/23 Telephone encounter, patient to discontinue Jakafi . Disenrolling patient from [facility]

## 2023-11-16 ENCOUNTER — Other Ambulatory Visit (HOSPITAL_COMMUNITY): Payer: Self-pay

## 2023-11-17 DIAGNOSIS — Z20822 Contact with and (suspected) exposure to covid-19: Secondary | ICD-10-CM | POA: Diagnosis not present

## 2023-11-17 DIAGNOSIS — R509 Fever, unspecified: Secondary | ICD-10-CM | POA: Diagnosis not present

## 2023-11-17 DIAGNOSIS — R0789 Other chest pain: Secondary | ICD-10-CM | POA: Diagnosis not present

## 2023-11-18 DIAGNOSIS — J9811 Atelectasis: Secondary | ICD-10-CM | POA: Diagnosis not present

## 2023-11-18 DIAGNOSIS — Z20822 Contact with and (suspected) exposure to covid-19: Secondary | ICD-10-CM | POA: Diagnosis not present

## 2023-11-18 DIAGNOSIS — N189 Chronic kidney disease, unspecified: Secondary | ICD-10-CM | POA: Diagnosis not present

## 2023-11-18 DIAGNOSIS — D638 Anemia in other chronic diseases classified elsewhere: Secondary | ICD-10-CM | POA: Diagnosis not present

## 2023-11-18 DIAGNOSIS — D696 Thrombocytopenia, unspecified: Secondary | ICD-10-CM | POA: Diagnosis not present

## 2023-11-18 DIAGNOSIS — R509 Fever, unspecified: Secondary | ICD-10-CM | POA: Diagnosis not present

## 2023-11-18 DIAGNOSIS — R079 Chest pain, unspecified: Secondary | ICD-10-CM | POA: Diagnosis not present

## 2023-11-18 DIAGNOSIS — D89811 Chronic graft-versus-host disease: Secondary | ICD-10-CM | POA: Diagnosis not present

## 2023-11-18 DIAGNOSIS — R0789 Other chest pain: Secondary | ICD-10-CM | POA: Diagnosis not present

## 2023-11-18 DIAGNOSIS — C9200 Acute myeloblastic leukemia, not having achieved remission: Secondary | ICD-10-CM | POA: Diagnosis not present

## 2023-11-18 DIAGNOSIS — Z86711 Personal history of pulmonary embolism: Secondary | ICD-10-CM | POA: Diagnosis not present

## 2023-11-18 DIAGNOSIS — K219 Gastro-esophageal reflux disease without esophagitis: Secondary | ICD-10-CM | POA: Diagnosis not present

## 2023-11-18 DIAGNOSIS — K5909 Other constipation: Secondary | ICD-10-CM | POA: Diagnosis not present

## 2023-11-18 DIAGNOSIS — N400 Enlarged prostate without lower urinary tract symptoms: Secondary | ICD-10-CM | POA: Diagnosis not present

## 2023-11-18 DIAGNOSIS — J90 Pleural effusion, not elsewhere classified: Secondary | ICD-10-CM | POA: Diagnosis not present

## 2023-11-20 ENCOUNTER — Other Ambulatory Visit: Payer: Self-pay

## 2023-11-20 ENCOUNTER — Other Ambulatory Visit (HOSPITAL_COMMUNITY): Payer: Self-pay

## 2023-11-21 DIAGNOSIS — R1011 Right upper quadrant pain: Secondary | ICD-10-CM | POA: Diagnosis not present

## 2023-11-21 DIAGNOSIS — R509 Fever, unspecified: Secondary | ICD-10-CM | POA: Diagnosis not present

## 2023-11-29 ENCOUNTER — Other Ambulatory Visit (HOSPITAL_COMMUNITY): Payer: Self-pay

## 2023-11-29 DIAGNOSIS — C9201 Acute myeloblastic leukemia, in remission: Secondary | ICD-10-CM | POA: Diagnosis not present

## 2023-11-29 DIAGNOSIS — Z9481 Bone marrow transplant status: Secondary | ICD-10-CM | POA: Diagnosis not present

## 2023-11-29 DIAGNOSIS — Z9484 Stem cells transplant status: Secondary | ICD-10-CM | POA: Diagnosis not present

## 2023-11-29 DIAGNOSIS — D89811 Chronic graft-versus-host disease: Secondary | ICD-10-CM | POA: Diagnosis not present

## 2023-11-30 DIAGNOSIS — K2900 Acute gastritis without bleeding: Secondary | ICD-10-CM | POA: Diagnosis not present

## 2023-11-30 DIAGNOSIS — N1832 Chronic kidney disease, stage 3b: Secondary | ICD-10-CM | POA: Diagnosis not present

## 2023-11-30 DIAGNOSIS — R1011 Right upper quadrant pain: Secondary | ICD-10-CM | POA: Diagnosis not present

## 2023-11-30 DIAGNOSIS — C9201 Acute myeloblastic leukemia, in remission: Secondary | ICD-10-CM | POA: Diagnosis not present

## 2023-11-30 DIAGNOSIS — K31A0 Gastric intestinal metaplasia, unspecified: Secondary | ICD-10-CM | POA: Diagnosis not present

## 2023-11-30 DIAGNOSIS — K3189 Other diseases of stomach and duodenum: Secondary | ICD-10-CM | POA: Diagnosis not present

## 2023-11-30 DIAGNOSIS — R1013 Epigastric pain: Secondary | ICD-10-CM | POA: Diagnosis not present

## 2023-11-30 DIAGNOSIS — K2950 Unspecified chronic gastritis without bleeding: Secondary | ICD-10-CM | POA: Diagnosis not present

## 2023-11-30 DIAGNOSIS — K219 Gastro-esophageal reflux disease without esophagitis: Secondary | ICD-10-CM | POA: Diagnosis not present

## 2023-11-30 DIAGNOSIS — Z86711 Personal history of pulmonary embolism: Secondary | ICD-10-CM | POA: Diagnosis not present

## 2023-11-30 DIAGNOSIS — D61818 Other pancytopenia: Secondary | ICD-10-CM | POA: Diagnosis not present

## 2023-12-05 DIAGNOSIS — K828 Other specified diseases of gallbladder: Secondary | ICD-10-CM | POA: Diagnosis not present

## 2023-12-07 DIAGNOSIS — E782 Mixed hyperlipidemia: Secondary | ICD-10-CM | POA: Diagnosis not present

## 2023-12-07 DIAGNOSIS — I10 Essential (primary) hypertension: Secondary | ICD-10-CM | POA: Diagnosis not present

## 2023-12-07 DIAGNOSIS — Z951 Presence of aortocoronary bypass graft: Secondary | ICD-10-CM | POA: Diagnosis not present

## 2023-12-07 DIAGNOSIS — Z8619 Personal history of other infectious and parasitic diseases: Secondary | ICD-10-CM | POA: Diagnosis not present

## 2023-12-07 DIAGNOSIS — I2510 Atherosclerotic heart disease of native coronary artery without angina pectoris: Secondary | ICD-10-CM | POA: Diagnosis not present

## 2023-12-07 DIAGNOSIS — F17290 Nicotine dependence, other tobacco product, uncomplicated: Secondary | ICD-10-CM | POA: Diagnosis not present

## 2023-12-07 DIAGNOSIS — Z79899 Other long term (current) drug therapy: Secondary | ICD-10-CM | POA: Diagnosis not present

## 2023-12-07 DIAGNOSIS — F17210 Nicotine dependence, cigarettes, uncomplicated: Secondary | ICD-10-CM | POA: Diagnosis not present

## 2023-12-07 DIAGNOSIS — I2583 Coronary atherosclerosis due to lipid rich plaque: Secondary | ICD-10-CM | POA: Diagnosis not present

## 2023-12-07 DIAGNOSIS — Z716 Tobacco abuse counseling: Secondary | ICD-10-CM | POA: Diagnosis not present

## 2023-12-11 ENCOUNTER — Other Ambulatory Visit (HOSPITAL_COMMUNITY): Payer: Self-pay

## 2023-12-12 ENCOUNTER — Other Ambulatory Visit: Payer: Self-pay

## 2023-12-19 DIAGNOSIS — I2583 Coronary atherosclerosis due to lipid rich plaque: Secondary | ICD-10-CM | POA: Diagnosis not present

## 2023-12-19 DIAGNOSIS — I10 Essential (primary) hypertension: Secondary | ICD-10-CM | POA: Diagnosis not present

## 2023-12-19 DIAGNOSIS — Z951 Presence of aortocoronary bypass graft: Secondary | ICD-10-CM | POA: Diagnosis not present

## 2023-12-19 DIAGNOSIS — Z79899 Other long term (current) drug therapy: Secondary | ICD-10-CM | POA: Diagnosis not present

## 2023-12-19 DIAGNOSIS — Z114 Encounter for screening for human immunodeficiency virus [HIV]: Secondary | ICD-10-CM | POA: Diagnosis not present

## 2023-12-19 DIAGNOSIS — I2510 Atherosclerotic heart disease of native coronary artery without angina pectoris: Secondary | ICD-10-CM | POA: Diagnosis not present

## 2023-12-19 DIAGNOSIS — Z125 Encounter for screening for malignant neoplasm of prostate: Secondary | ICD-10-CM | POA: Diagnosis not present

## 2023-12-19 DIAGNOSIS — E782 Mixed hyperlipidemia: Secondary | ICD-10-CM | POA: Diagnosis not present

## 2023-12-22 DIAGNOSIS — Z7902 Long term (current) use of antithrombotics/antiplatelets: Secondary | ICD-10-CM | POA: Diagnosis not present

## 2023-12-22 DIAGNOSIS — I2510 Atherosclerotic heart disease of native coronary artery without angina pectoris: Secondary | ICD-10-CM | POA: Diagnosis not present

## 2023-12-22 DIAGNOSIS — Z79899 Other long term (current) drug therapy: Secondary | ICD-10-CM | POA: Diagnosis not present

## 2023-12-22 DIAGNOSIS — F17210 Nicotine dependence, cigarettes, uncomplicated: Secondary | ICD-10-CM | POA: Diagnosis not present

## 2023-12-22 DIAGNOSIS — F17290 Nicotine dependence, other tobacco product, uncomplicated: Secondary | ICD-10-CM | POA: Diagnosis not present

## 2023-12-22 DIAGNOSIS — B182 Chronic viral hepatitis C: Secondary | ICD-10-CM | POA: Diagnosis not present

## 2023-12-22 DIAGNOSIS — I10 Essential (primary) hypertension: Secondary | ICD-10-CM | POA: Diagnosis not present

## 2023-12-25 ENCOUNTER — Other Ambulatory Visit (HOSPITAL_COMMUNITY): Payer: Self-pay

## 2023-12-25 MED ORDER — POTASSIUM CHLORIDE CRYS ER 20 MEQ PO TBCR
40.0000 meq | EXTENDED_RELEASE_TABLET | Freq: Every day | ORAL | 1 refills | Status: AC
  Filled 2023-12-25: qty 60, 30d supply, fill #0
  Filled 2024-01-24: qty 60, 30d supply, fill #1

## 2023-12-27 DIAGNOSIS — Z9481 Bone marrow transplant status: Secondary | ICD-10-CM | POA: Diagnosis not present

## 2023-12-27 DIAGNOSIS — C9201 Acute myeloblastic leukemia, in remission: Secondary | ICD-10-CM | POA: Diagnosis not present

## 2023-12-27 DIAGNOSIS — Z9484 Stem cells transplant status: Secondary | ICD-10-CM | POA: Diagnosis not present

## 2023-12-27 DIAGNOSIS — D89811 Chronic graft-versus-host disease: Secondary | ICD-10-CM | POA: Diagnosis not present

## 2024-01-04 ENCOUNTER — Other Ambulatory Visit: Payer: Self-pay

## 2024-01-04 ENCOUNTER — Other Ambulatory Visit (HOSPITAL_COMMUNITY): Payer: Self-pay

## 2024-01-04 DIAGNOSIS — K811 Chronic cholecystitis: Secondary | ICD-10-CM | POA: Diagnosis not present

## 2024-01-04 DIAGNOSIS — K219 Gastro-esophageal reflux disease without esophagitis: Secondary | ICD-10-CM | POA: Diagnosis not present

## 2024-01-04 DIAGNOSIS — K828 Other specified diseases of gallbladder: Secondary | ICD-10-CM | POA: Diagnosis not present

## 2024-01-04 DIAGNOSIS — D61818 Other pancytopenia: Secondary | ICD-10-CM | POA: Diagnosis not present

## 2024-01-04 DIAGNOSIS — N1832 Chronic kidney disease, stage 3b: Secondary | ICD-10-CM | POA: Diagnosis not present

## 2024-01-04 DIAGNOSIS — Z87891 Personal history of nicotine dependence: Secondary | ICD-10-CM | POA: Diagnosis not present

## 2024-01-04 DIAGNOSIS — C9201 Acute myeloblastic leukemia, in remission: Secondary | ICD-10-CM | POA: Diagnosis not present

## 2024-01-04 DIAGNOSIS — Z86711 Personal history of pulmonary embolism: Secondary | ICD-10-CM | POA: Diagnosis not present

## 2024-01-04 DIAGNOSIS — Z79899 Other long term (current) drug therapy: Secondary | ICD-10-CM | POA: Diagnosis not present

## 2024-01-04 MED ORDER — ONDANSETRON HCL 4 MG PO TABS
4.0000 mg | ORAL_TABLET | Freq: Three times a day (TID) | ORAL | 0 refills | Status: AC | PRN
  Filled 2024-01-04: qty 20, 7d supply, fill #0

## 2024-01-04 MED ORDER — HYDROCODONE-ACETAMINOPHEN 5-325 MG PO TABS
1.0000 | ORAL_TABLET | Freq: Four times a day (QID) | ORAL | 0 refills | Status: AC | PRN
  Filled 2024-01-04: qty 15, 2d supply, fill #0

## 2024-01-24 ENCOUNTER — Encounter (HOSPITAL_COMMUNITY): Payer: Self-pay

## 2024-01-24 ENCOUNTER — Other Ambulatory Visit: Payer: Self-pay

## 2024-01-24 ENCOUNTER — Other Ambulatory Visit (HOSPITAL_COMMUNITY): Payer: Self-pay

## 2024-01-24 MED ORDER — METOPROLOL TARTRATE 25 MG PO TABS
12.5000 mg | ORAL_TABLET | Freq: Two times a day (BID) | ORAL | 3 refills | Status: AC
  Filled 2024-01-24: qty 90, 90d supply, fill #0

## 2024-01-24 MED ORDER — PANTOPRAZOLE SODIUM 40 MG PO TBEC
40.0000 mg | DELAYED_RELEASE_TABLET | Freq: Every day | ORAL | 3 refills | Status: AC
  Filled 2024-01-24: qty 90, 90d supply, fill #0

## 2024-01-25 ENCOUNTER — Other Ambulatory Visit (HOSPITAL_COMMUNITY): Payer: Self-pay

## 2024-01-25 ENCOUNTER — Encounter (HOSPITAL_COMMUNITY): Payer: Self-pay

## 2024-01-26 ENCOUNTER — Other Ambulatory Visit (HOSPITAL_COMMUNITY): Payer: Self-pay

## 2024-01-29 ENCOUNTER — Other Ambulatory Visit (HOSPITAL_COMMUNITY): Payer: Self-pay

## 2024-01-30 ENCOUNTER — Other Ambulatory Visit (HOSPITAL_COMMUNITY): Payer: Self-pay

## 2024-02-01 ENCOUNTER — Other Ambulatory Visit (HOSPITAL_COMMUNITY): Payer: Self-pay

## 2024-02-05 ENCOUNTER — Other Ambulatory Visit (HOSPITAL_COMMUNITY): Payer: Self-pay

## 2024-02-22 NOTE — Telephone Encounter (Signed)
 Pt wife is calling. She says pt has a fissure and will be seen with GI soon. In the meantime he is having a lot of pain, constant pain and also with having BM's. They are doing sitz baths, ointments but nothing is helping. The GI office told them it was ok to take 1 tylenol  and 1 Iburprophen together for the pain. He remembers he was told here that he shouldn't take Ibuprophen because of his kidneys. Asking if the team thinks its ok for him to take Ibuprophen?  Please advise.  Rumalda Furnace, RN  [place], [phone] or [place], [phone]

## 2024-02-23 ENCOUNTER — Other Ambulatory Visit (HOSPITAL_COMMUNITY): Payer: Self-pay

## 2024-02-23 ENCOUNTER — Other Ambulatory Visit: Payer: Self-pay

## 2024-02-23 MED ORDER — HYDROCORT-PRAMOXINE (PERIANAL) 2.5-1 % EX CREA
TOPICAL_CREAM | CUTANEOUS | 0 refills | Status: DC
  Filled 2024-02-23: qty 30, 10d supply, fill #0
  Filled 2024-02-23: qty 30, 30d supply, fill #0

## 2024-02-23 MED ORDER — OXYCODONE HCL 5 MG PO TABS
5.0000 mg | ORAL_TABLET | Freq: Four times a day (QID) | ORAL | 0 refills | Status: AC | PRN
  Filled 2024-02-23: qty 20, 5d supply, fill #0

## 2024-02-23 MED ORDER — LIDOCAINE HCL 2 % EX GEL
1.0000 | Freq: Three times a day (TID) | CUTANEOUS | 0 refills | Status: AC
  Filled 2024-02-23 (×2): qty 30, 10d supply, fill #0
